# Patient Record
Sex: Female | Born: 1950 | Race: Asian | Hispanic: No | Marital: Married | Smoking: Never smoker
Health system: Western US, Academic
[De-identification: ages and names within clinical notes are randomized; demographics above are authoritative.]

---

## 2018-10-28 ENCOUNTER — Ambulatory Visit (INDEPENDENT_AMBULATORY_CARE_PROVIDER_SITE_OTHER)

## 2018-10-28 DIAGNOSIS — G5702 Lesion of sciatic nerve, left lower limb: Principal | ICD-10-CM

## 2018-10-31 ENCOUNTER — Ambulatory Visit (INDEPENDENT_AMBULATORY_CARE_PROVIDER_SITE_OTHER)

## 2018-11-04 ENCOUNTER — Ambulatory Visit (INDEPENDENT_AMBULATORY_CARE_PROVIDER_SITE_OTHER)

## 2018-11-04 DIAGNOSIS — G5702 Lesion of sciatic nerve, left lower limb: Principal | ICD-10-CM

## 2018-11-07 ENCOUNTER — Ambulatory Visit (INDEPENDENT_AMBULATORY_CARE_PROVIDER_SITE_OTHER)

## 2018-11-07 DIAGNOSIS — G5702 Lesion of sciatic nerve, left lower limb: Principal | ICD-10-CM

## 2018-11-11 ENCOUNTER — Ambulatory Visit (INDEPENDENT_AMBULATORY_CARE_PROVIDER_SITE_OTHER)

## 2018-11-14 ENCOUNTER — Encounter (INDEPENDENT_AMBULATORY_CARE_PROVIDER_SITE_OTHER): Payer: Self-pay | Admitting: Internal Medicine

## 2018-11-14 ENCOUNTER — Ambulatory Visit (INDEPENDENT_AMBULATORY_CARE_PROVIDER_SITE_OTHER): Admitting: Internal Medicine

## 2018-11-14 ENCOUNTER — Ambulatory Visit (INDEPENDENT_AMBULATORY_CARE_PROVIDER_SITE_OTHER)

## 2018-11-14 VITALS — BP 171/79 | HR 66 | Temp 98.4°F | Ht 60.0 in | Wt 114.0 lb

## 2018-11-14 DIAGNOSIS — I639 Cerebral infarction, unspecified: Secondary | ICD-10-CM

## 2018-11-14 DIAGNOSIS — M5030 Other cervical disc degeneration, unspecified cervical region: Principal | ICD-10-CM

## 2018-11-14 DIAGNOSIS — G5702 Lesion of sciatic nerve, left lower limb: Principal | ICD-10-CM

## 2018-11-14 DIAGNOSIS — I10 Essential (primary) hypertension: Secondary | ICD-10-CM

## 2018-11-14 MED ORDER — TRIAMCINOLONE ACETONIDE 0.5 % EX CREA
TOPICAL_CREAM | CUTANEOUS | Status: AC
Start: 2018-10-09 — End: ?

## 2018-11-14 MED ORDER — DICLOFENAC SODIUM 1 % EX GEL
TRANSDERMAL | Status: AC
Start: 2018-10-11 — End: ?

## 2018-11-14 MED ORDER — FLUTICASONE PROPIONATE 50 MCG/ACT NA SUSP
NASAL | Status: AC
Start: 2018-10-31 — End: ?

## 2018-11-14 MED ORDER — ROSUVASTATIN CALCIUM 5 MG OR TABS
ORAL_TABLET | ORAL | Status: AC
Start: 2018-10-09 — End: ?

## 2018-11-14 MED ORDER — ALENDRONATE SODIUM 70 MG OR TABS
ORAL_TABLET | ORAL | Status: AC
Start: 2018-10-09 — End: ?

## 2018-11-14 MED ORDER — CARVEDILOL 6.25 MG OR TABS
ORAL_TABLET | ORAL | Status: AC
Start: 2018-08-22 — End: ?

## 2018-11-14 MED ORDER — TRIAMCINOLONE ACETONIDE 0.1 % MOUTH/THROAT PASTE
PASTE | OROMUCOSAL | Status: AC
Start: 2018-10-09 — End: ?

## 2018-11-14 MED ORDER — CARVEDILOL 3.125 MG OR TABS
ORAL_TABLET | ORAL | Status: AC
Start: 2018-10-09 — End: ?

## 2018-11-15 ENCOUNTER — Telehealth: Payer: Self-pay | Admitting: Hospital

## 2018-11-15 NOTE — Telephone Encounter (Signed)
Verified: Mobile Phone   MyChart Activation code sent to patient's: Mobile Phone

## 2018-11-18 ENCOUNTER — Ambulatory Visit (INDEPENDENT_AMBULATORY_CARE_PROVIDER_SITE_OTHER)

## 2018-11-21 ENCOUNTER — Ambulatory Visit (INDEPENDENT_AMBULATORY_CARE_PROVIDER_SITE_OTHER)

## 2018-11-25 ENCOUNTER — Ambulatory Visit (INDEPENDENT_AMBULATORY_CARE_PROVIDER_SITE_OTHER)

## 2018-11-25 DIAGNOSIS — G5702 Lesion of sciatic nerve, left lower limb: Principal | ICD-10-CM

## 2018-11-27 LAB — HEMOGLOBIN A1C - EXTERNAL
Estimated Mean Glucose: 120 mg/dL
Hemoglobin A1C: 5.8 % — ABNORMAL HIGH (ref ?–5.7)

## 2018-11-28 ENCOUNTER — Ambulatory Visit (INDEPENDENT_AMBULATORY_CARE_PROVIDER_SITE_OTHER)

## 2018-11-28 DIAGNOSIS — G5702 Lesion of sciatic nerve, left lower limb: Principal | ICD-10-CM

## 2018-12-02 ENCOUNTER — Ambulatory Visit (INDEPENDENT_AMBULATORY_CARE_PROVIDER_SITE_OTHER)

## 2018-12-05 ENCOUNTER — Ambulatory Visit (INDEPENDENT_AMBULATORY_CARE_PROVIDER_SITE_OTHER)

## 2018-12-09 ENCOUNTER — Ambulatory Visit (INDEPENDENT_AMBULATORY_CARE_PROVIDER_SITE_OTHER)

## 2018-12-09 DIAGNOSIS — M5030 Other cervical disc degeneration, unspecified cervical region: Principal | ICD-10-CM

## 2018-12-12 ENCOUNTER — Ambulatory Visit (INDEPENDENT_AMBULATORY_CARE_PROVIDER_SITE_OTHER)

## 2018-12-12 DIAGNOSIS — M5030 Other cervical disc degeneration, unspecified cervical region: Principal | ICD-10-CM

## 2018-12-16 ENCOUNTER — Ambulatory Visit (INDEPENDENT_AMBULATORY_CARE_PROVIDER_SITE_OTHER)

## 2018-12-19 ENCOUNTER — Ambulatory Visit (INDEPENDENT_AMBULATORY_CARE_PROVIDER_SITE_OTHER)

## 2019-01-14 ENCOUNTER — Encounter (INDEPENDENT_AMBULATORY_CARE_PROVIDER_SITE_OTHER): Payer: Self-pay

## 2019-04-01 LAB — HEMOGLOBIN A1C - EXTERNAL
Estimated Mean Glucose: 126 mg/dL
Hemoglobin A1C: 6 % — ABNORMAL HIGH (ref ?–5.7)

## 2019-07-07 LAB — HEMOGLOBIN A1C - EXTERNAL
Estimated Mean Glucose: 120 mg/dL
Hemoglobin A1C: 5.8 % — ABNORMAL HIGH (ref ?–5.7)

## 2019-10-03 LAB — HEMOGLOBIN A1C - EXTERNAL
Estimated Mean Glucose: 108 mg/dL
Hemoglobin A1C: 5.4 % (ref ?–5.7)

## 2020-01-15 LAB — HEMOGLOBIN A1C - EXTERNAL
Estimated Mean Glucose: 126 mg/dL
Hemoglobin A1C: 6 % — ABNORMAL HIGH (ref ?–5.7)

## 2020-05-18 LAB — HEMOGLOBIN A1C - EXTERNAL
Estimated Mean Glucose: 137 mg/dL
Hemoglobin A1C: 6.4 % — ABNORMAL HIGH (ref ?–5.7)

## 2021-09-02 ENCOUNTER — Telehealth (HOSPITAL_COMMUNITY): Payer: Self-pay

## 2021-09-02 ENCOUNTER — Encounter (HOSPITAL_COMMUNITY): Payer: Self-pay

## 2021-09-02 NOTE — Telephone Encounter (Signed)
External Fax Referral    Left  voicemail for patient to schedule from referral     If patient calls back, schedule from Referral    Referral created and uploaded to Media

## 2021-12-07 ENCOUNTER — Other Ambulatory Visit: Payer: Self-pay

## 2021-12-14 ENCOUNTER — Ambulatory Visit (HOSPITAL_COMMUNITY): Payer: Medicare Other | Admitting: Vascular Neurology

## 2021-12-14 ENCOUNTER — Encounter (HOSPITAL_COMMUNITY): Payer: Self-pay

## 2021-12-16 ENCOUNTER — Other Ambulatory Visit: Payer: Self-pay

## 2021-12-20 ENCOUNTER — Other Ambulatory Visit: Payer: Self-pay

## 2021-12-20 ENCOUNTER — Ambulatory Visit: Payer: Medicare Other | Attending: Vascular Neurology | Admitting: Vascular Neurology

## 2021-12-20 DIAGNOSIS — I69398 Other sequelae of cerebral infarction: Secondary | ICD-10-CM | POA: Insufficient documentation

## 2021-12-20 DIAGNOSIS — I639 Cerebral infarction, unspecified: Secondary | ICD-10-CM | POA: Insufficient documentation

## 2021-12-20 DIAGNOSIS — F070 Personality change due to known physiological condition: Secondary | ICD-10-CM | POA: Insufficient documentation

## 2021-12-20 NOTE — Progress Notes (Signed)
---------------------(data below generated by Fernande Boyden, MD)------------------------    NEUROLOGY NOTE  Demographics:  Date: December 20, 2021  Patient Name: Ashley Richards  Medical Record #: [id number]  DOB: 1951/05/01  Age: 71 year old  Sex: female    Consult Requested by:  No att. providers found    Evaluator(s):   Patient was evaluated by: Neurology Attending     No chief complaint on file.      People present:  Patient    History of Present Illness:     Ashley Richards is an 71 year old female with HTN/HLD/multiple strokes.  Brother (stroke in 54s) and father with strokes (70s).  No smoking.       12/2016: first stroke ischemic. Ashley Richards started-- 65 years ago.   01/2017: expansion of first stroke (clot. Changed to plavix  04/2017: ICH in new area (right brain)  09/2017: ____. Was on plavix  10/2017:  2021: right frontal ischemic stroke  BRVO:  May 2022    On plavix, statin.   2018/2019 [name] [facility] [place]. All imaging/records at [facility]      Event monitor for >1 day.     Residual symptom of R sided sided heaviness since 2022.     Only has mild left sided headaches, rare.         REVIEW OF SYSTEMS  The patient was questioned about the following medical issues. All symptoms were denied, except those in HPI and in bold, which the patient does endorse.    Constitutional: weight loss, weight gain, fever, fatigue.  HENT: voice changes, dysphagia, hematemesis.  Eyes: visual disturbance, blurred vision, double vision, glaucoma, eye injury.  Respiratory: shortness of breath, wheezing, chronic cough.  Cardiovascular: chest pain, leg swelling, palpitations.  Gastrointestinal: nausea, vomiting, constipation, diarrhea, incontinence.  Genitourinary: dysuria, incontinence, frequency, kidney stones, sexual dysfunction.  Musculoskeletal: joint pain, joint swelling, gait problem.  Skin: color change, pallor, rash, itching.  Neurological: seizures, numbness, lightheadedness, tremors, muscle weakness/cramping, stroke,  headaches, head injury, LOC, falling, memory problems, anosmia, insomnia, sleep fragmentation.  Hematological: bruise/bleed easily, anemia.  Psychiatric/Behavioral: hallucinations, anxiety, depression, behavioral problems, abuse hx.  Endocrine: thyroid disease, excess thirst, excess urination.      Allergies:   No Known Allergies    Medications: None unless otherwise stated in HPI or below.  Current Outpatient Medications   Medication Sig   . alendronate (FOSAMAX) 70 MG tablet    . carvedilol (COREG) 3.125 MG tablet    . carvedilol (COREG) 6.25 MG tablet    . diclofenac (VOLTAREN) 1 % gel    . fluticasone propionate (FLONASE) 50 MCG/ACT nasal spray    . rosuvastatin (CRESTOR) 5 MG tablet    . triamcinolone (KENALOG) 0.5 % cream    . triamcinolone acetonide (KENALOG) 0.1 % paste      No current facility-administered medications for this visit.       PMH: No past medical history unless otherwise stated in HPI or below.   No past medical history on file.    PSH:  No past surgical history on file.    FH:  No family history of neurologic disease unless otherwise stated in HPI or below.     Social Hx: No active drug, alcohol, or tobacco use unless otherwise stated in HPI or below.     Exam:   On Exam today, I find:   Vital signs: Recorded in chart, and reviewed by me today.  There were no vitals taken for  this visit.    General Exam  General: Patient is well developed, & in no acute distress.     Neurologic Exam  Neurologic Exam:  MS:  Alert, awake, oriented to person, place, and time.  Language intact to naming, repetition, fluency, and comprehension. No neglect.        Pertinent Diagnostics:       None         Assessment/Plan: 71 yo female with recurrent strokes. Need to review records to obtain full details.   Get records and imaging from prior strokes.   New MRI brain w/wo GAD, include black blood to r/o inflammation  CTA head/neck  Hypercoagulable w/u.  Autoimmune workup  30 day monitor.   ILR if above  negative  Continue plavix  F/u 6 weeks.         Note Author: Fernande Boyden, MD      This patient was counseled extensively on the disease process above in regards to etiology, pathology, and prevention.  I have also spent time counseling them about the signs of neurologic emergencies for which they should call 911. This includes, but is not limited to symptoms of stroke such as weakness, numbness, diplopia, vertigo, gait imbalance, dysarthria, aphasia, and headache.

## 2021-12-21 ENCOUNTER — Telehealth (HOSPITAL_COMMUNITY): Payer: Self-pay

## 2021-12-21 ENCOUNTER — Other Ambulatory Visit: Payer: Self-pay

## 2021-12-21 NOTE — Telephone Encounter (Signed)
Received medical record reports from [place] on 12/21/21. Reports will be sent to [facility] Records Department to be scanned into [employer].    I've requested a hard copy of CD imaging from [place]:  P; [phone]  F: [phone]    Requested medical records and a hard copy of CD imaging from [facility]:  P: [phone]  F: [phone]    Closing encounter.

## 2021-12-27 ENCOUNTER — Telehealth (HOSPITAL_COMMUNITY): Payer: Self-pay | Admitting: Vascular Neurology

## 2021-12-27 NOTE — Telephone Encounter (Signed)
Patient called and stated she wants to have labs done at [facility] I faxed over lab order to [phone]. Just FYI Thank you.

## 2022-01-02 NOTE — Telephone Encounter (Signed)
Noted, closing encounter.

## 2022-01-03 ENCOUNTER — Telehealth (HOSPITAL_COMMUNITY): Payer: Self-pay

## 2022-01-03 NOTE — Telephone Encounter (Signed)
Sent second request for a hard copy CD of Imaging for patient from [facility] Medical Records Department.   P:[phone]  F: [phone]    Closing encounter.

## 2022-01-17 ENCOUNTER — Other Ambulatory Visit: Payer: Medicare Other | Attending: Vascular Neurology

## 2022-01-17 DIAGNOSIS — F070 Personality change due to known physiological condition: Secondary | ICD-10-CM | POA: Insufficient documentation

## 2022-01-17 DIAGNOSIS — I639 Cerebral infarction, unspecified: Secondary | ICD-10-CM

## 2022-01-17 DIAGNOSIS — I69398 Other sequelae of cerebral infarction: Secondary | ICD-10-CM | POA: Insufficient documentation

## 2022-01-17 LAB — BASIC METABOLIC PANEL, BLOOD
Anion Gap: 11 mmol/L (ref 7–15)
BUN: 24 mg/dL — ABNORMAL HIGH (ref 8–23)
Bicarbonate: 25 mmol/L (ref 22–29)
Calcium: 9.3 mg/dL (ref 8.5–10.6)
Chloride: 101 mmol/L (ref 98–107)
Creatinine: 0.89 mg/dL (ref 0.51–0.95)
Glucose: 135 mg/dL — ABNORMAL HIGH (ref 70–99)
Potassium: 4.5 mmol/L (ref 3.5–5.1)
Sodium: 137 mmol/L (ref 136–145)
eGFR Based on CKD-EPI 2021 Equation: >60 mL/min/{1.73_m2}

## 2022-01-17 LAB — HOMOCYSTEINE, TOTAL, BLOOD: Homocysteine, Total-Blood: 8 umol/L (ref 0–14)

## 2022-01-17 LAB — C4, BLOOD: C4: 26 mg/dL (ref 10–40)

## 2022-01-17 LAB — SED RATE, BLOOD: Sed Rate: 22 mm/hr (ref 0–30)

## 2022-01-17 LAB — IMMUNOGLOBULIN PANEL (IGA,IGG,IGM), BLOOD
IGA: 159 mg/dL (ref 70–400)
IGG: 1067 mg/dL (ref 700–1600)
IGM: 54 mg/dL (ref 40–230)

## 2022-01-17 LAB — RF (RHEUMATOID FACTOR), BLOOD: RF: <10 [IU]/mL (ref 0–13)

## 2022-01-17 LAB — C3, BLOOD: C3: 141 mg/dL (ref 90–180)

## 2022-01-17 LAB — FIBRINOGEN, BLOOD: Fibrinogen: 288 mg/dL (ref 200–450)

## 2022-01-17 LAB — C-REACTIVE PROTEIN, BLOOD: CRP: <0.3 mg/dL (ref <0.5)

## 2022-01-18 LAB — MTHFR, 2 MUTATIONS, BLOOD
MTHFR C.1286A>C VARIANT: NEGATIVE
MTHFR C.665C>T VARIANT: NEGATIVE

## 2022-01-18 LAB — CARDIOLIPIN IGG: Cardiolipin IgG: <2 GPL U/mL (ref <10.0)

## 2022-01-18 LAB — SSB, BLOOD: SSB Ab: <0.6 U/mL (ref <7.0)

## 2022-01-18 LAB — ANTI-THROMBIN III, BLOOD: Antithrombin III: 115 % (ref 80–140)

## 2022-01-18 LAB — HIV 1/2 ANTIBODY & P24 ANTIGEN ASSAY, BLOOD: HIV 1/2 Antibody & P24 Antigen Assay: NONREACTIVE

## 2022-01-18 LAB — DIL RUSSELL'S VIPER VENOM, BLOOD
DRWT, Control: 33.8 s
DRWT, Patient: 31.9 s
DRWT, Ratio (Pt/Con): 0.94 (ref <1.20)

## 2022-01-18 LAB — PROTEIN S ANTIGEN - FREE, BLOOD: Protein S Antigen - Free: 81 % (ref 65–145)

## 2022-01-18 LAB — PROTHROMBIN G20210A MUTATION, BLOOD: F2 c.*97G>A: NEGATIVE

## 2022-01-18 LAB — PROTEIN C ACTIVITY ASSAY, BLOOD: Protein C Activity: 134 % (ref 70–160)

## 2022-01-18 LAB — BETA 2 GLYCOPROTEIN ANTIBODY
Beta 2 Glycoprotein IgA: 2 U/mL (ref <7.0)
Beta 2 Glycoprotein IgG: <1.1 U/mL (ref <7.0)
Beta 2 Glycoprotein IgM: <2.4 U/mL (ref <7.0)

## 2022-01-18 LAB — HEPATITIS C AB, BLOOD: Hepatitis C Ab: NONREACTIVE

## 2022-01-18 LAB — FACTOR VIII ACTIVITY ASSAY, BLOOD: Factor VIII Activity Assay: 139 % (ref 55–140)

## 2022-01-18 LAB — HEPATITIS B CORE IGG/IGM PANEL, BLOOD
HBcAb IgM: NONREACTIVE
HBcAb Total: REACTIVE — AB

## 2022-01-18 LAB — SYPHILIS EIA SCREEN, BLOOD: Syphilis Screen: NEGATIVE

## 2022-01-18 LAB — SSA, BLOOD: SSA Ab: <0.5 U/mL (ref <7.0)

## 2022-01-18 LAB — ANA (ANTI-NUCLEAR AB), BLOOD: ANA (Anti-Nuclear Ab): NEGATIVE

## 2022-01-18 LAB — RNP (RIBONUCLEICPROTEIN AB), BLOOD: RNP (Ribonucloprotein Ab): 1.5 U/mL (ref <5.0)

## 2022-01-19 ENCOUNTER — Telehealth (HOSPITAL_COMMUNITY): Payer: Self-pay | Admitting: Vascular Neurology

## 2022-01-19 ENCOUNTER — Other Ambulatory Visit: Payer: Self-pay

## 2022-01-19 ENCOUNTER — Other Ambulatory Visit: Payer: Medicare Other

## 2022-01-19 DIAGNOSIS — I639 Cerebral infarction, unspecified: Secondary | ICD-10-CM

## 2022-01-19 LAB — EPG, SERUM
A/G Ratio, EPG: 1.08
Albumin, EPG: 3.59 gm/dL (ref 3.20–5.00)
Alpha 1, EPG: 0.25 gm/dL (ref 0.10–0.40)
Alpha 2, EPG: 0.86 gm/dL (ref 0.60–1.10)
Beta, EPG: 1.08 gm/dL (ref 0.60–1.30)
Gamma, EPG: 1.12 gm/dL (ref 0.70–1.50)
Total Protein, EPG: 6.9 gm/dL (ref 6.00–8.00)

## 2022-01-19 LAB — EPG, INTERPRETATION, SERUM

## 2022-01-19 NOTE — Telephone Encounter (Signed)
Discussed on telephone 01/18/22 with patient and husband Heb B ab REACTIVE results with patient. I explained to her that she should discuss these results with her PCP to see what needs to be done next. This can be that she has active or chronic Hepatitis B.

## 2022-01-20 ENCOUNTER — Telehealth (INDEPENDENT_AMBULATORY_CARE_PROVIDER_SITE_OTHER): Payer: Medicare Other

## 2022-01-20 ENCOUNTER — Encounter (HOSPITAL_COMMUNITY): Payer: Self-pay | Admitting: Vascular Neurology

## 2022-01-20 LAB — ANTI-NEUTRO CYTOPLASMA AB, BLOOD: Anti-Neutro Cytoplasm Ab: NEGATIVE

## 2022-01-20 LAB — 24 HOUR URINE PROTEIN ELECTROPHORESIS
Albumin, Urine: INVALID mg/dL — IN
Alpha 1, Urine: INVALID mg/dL — IN
Alpha 2, Urine: INVALID mg/dL — IN
Beta, Urine: INVALID mg/dL — IN
Duration: 24 hours (ref 0–48)
Gamma, Urine: INVALID mg/dL — IN
Total Protein, UPEP: INVALID mg/dL — IN
Volume: 2000 mL (ref >=600)

## 2022-01-20 LAB — APC RESISTANT FV(LEIDEN): Active Prot C Resist Fva: NORMAL

## 2022-01-23 LAB — CRYOGLOBULIN QUAL/QUANT 72 HR, REGULAR

## 2022-01-24 ENCOUNTER — Ambulatory Visit
Admission: RE | Admit: 2022-01-24 | Discharge: 2022-01-24 | Disposition: A | Discharge status: Home / Self Care | Payer: Medicare Other | Attending: Vascular Neurology | Admitting: Vascular Neurology

## 2022-01-24 ENCOUNTER — Ambulatory Visit (HOSPITAL_BASED_OUTPATIENT_CLINIC_OR_DEPARTMENT_OTHER)
Admit: 2022-01-24 | Discharge: 2022-01-24 | Disposition: A | Discharge status: Home Health | Payer: Medicare Other | Attending: Vascular Neurology | Admitting: Vascular Neurology

## 2022-01-24 ENCOUNTER — Other Ambulatory Visit: Payer: Self-pay

## 2022-01-24 DIAGNOSIS — I6622 Occlusion and stenosis of left posterior cerebral artery: Secondary | ICD-10-CM

## 2022-01-24 DIAGNOSIS — G9389 Other specified disorders of brain: Secondary | ICD-10-CM

## 2022-01-24 DIAGNOSIS — R9082 White matter disease, unspecified: Secondary | ICD-10-CM

## 2022-01-24 DIAGNOSIS — I672 Cerebral atherosclerosis: Secondary | ICD-10-CM

## 2022-01-24 DIAGNOSIS — I639 Cerebral infarction, unspecified: Secondary | ICD-10-CM | POA: Insufficient documentation

## 2022-01-24 DIAGNOSIS — I6389 Other cerebral infarction: Secondary | ICD-10-CM

## 2022-01-24 DIAGNOSIS — I6602 Occlusion and stenosis of left middle cerebral artery: Secondary | ICD-10-CM

## 2022-01-24 DIAGNOSIS — R930 Abnormal findings on diagnostic imaging of skull and head, not elsewhere classified: Secondary | ICD-10-CM

## 2022-01-24 DIAGNOSIS — Z8673 Personal history of transient ischemic attack (TIA), and cerebral infarction without residual deficits: Secondary | ICD-10-CM

## 2022-01-24 MED ORDER — GADOBUTROL 1 MMOL/ML IV SOLN (WRAPPED RECORD)
7.5000 mL | Freq: Once | INTRAVENOUS | Status: AC
Start: 2022-01-25 — End: 2022-01-24
  Administered 2022-01-24: 5 mL via INTRAVENOUS

## 2022-01-24 MED ORDER — IOHEXOL 350 MG/ML IV SOLN
50.0000 mL | Freq: Once | INTRAVENOUS | Status: AC
Start: 2022-01-24 — End: 2022-01-24
  Administered 2022-01-24: 50 mL via INTRAVENOUS
  Filled 2022-01-24: qty 50

## 2022-01-31 ENCOUNTER — Ambulatory Visit: Payer: Medicare Other | Attending: Vascular Neurology | Admitting: Vascular Neurology

## 2022-01-31 ENCOUNTER — Telehealth (HOSPITAL_COMMUNITY): Payer: Self-pay

## 2022-01-31 DIAGNOSIS — R9082 White matter disease, unspecified: Secondary | ICD-10-CM

## 2022-01-31 DIAGNOSIS — I6381 Other cerebral infarction due to occlusion or stenosis of small artery: Secondary | ICD-10-CM | POA: Insufficient documentation

## 2022-01-31 DIAGNOSIS — I69351 Hemiplegia and hemiparesis following cerebral infarction affecting right dominant side: Secondary | ICD-10-CM | POA: Insufficient documentation

## 2022-01-31 NOTE — Progress Notes (Signed)
---------------------(data below generated by Royya Fatima Modir, MD)--------------------     Patient Verification & Telemedicine Consent & Financial Waiver:    1.   Identity: I have verified this patient's identity to be accurate.  2.   Consent: I verify consent has been secured in one of the following methods: (a) obtained written/ online attestation consent (via MyChartVideoVisit pathway), (b) the spoke-side provider has obtained verbal or written consent from patient/surrogate (if this is a "provider to provider" evaluation), or (c) in all other cases, I have personally obtained verbal consent from the patient/ surrogate (noting all elements below) to perform this voluntary telemedicine evaluation (including obtaining history, performing examination and reviewing data provided by the patient).   The patient/ surrogate has the right to refuse this evaluation.  I have explained risks (including potential loss of confidentiality), benefits, alternatives, and the potential need for subsequent face to face care. Patient/ surrogate understands that there is a risk of medical inaccuracies given that our recommendations will be made based on reported data (and we must therefore assume this information is accurate).  Knowing that there is a risk that this information is not reported accurately, and that the telemedicine video, audio, or data feed may be incomplete, the patient agrees to proceed with evaluation and holds us harmless knowing these risks.  3.   Healthcare Team: The patient/ surrogate has been notified that other healthcare professionals (including students, residents and technical personnel) may be involved in this audio-video evaluation.   All laws concerning confidentiality and patient access to medical records and copies of medical records apply to telemedicine.  4.   Privacy: If this is a MyChart Video Visit, the patient/ surrogate has received the [place] Notice of Privacy Practices via E-Checkin process.   For all other video visit techniques, I have verbally provided the patient/ surrogate with the [place] web link in English ([email or web address]://[email or web address]/hipaa.aspx) or Spanish (https://health..edu/hipaa/Pages/hipaa_sp.aspx).  The patient/ surrogate acknowledges both being provided the NPP link, and has been offered to have the NPP mailed to the patient/ surrogate by US mail.  The patient/ surrogate has voiced understanding an acknowledgement of receipt of this NPP web address.  If the patient/surrogate has elected to receive the NPP via US mail, I verify that the NPP will be sent promptly to the patient/surrogate via US mail.  5.   Capacity: I have reviewed this above verification and consent paragraph with the patient/ surrogate and the patient is capacitated or has a surrogate. If the patient is not capacitated to understand the above, and no surrogate is available, since this is not an emergency evaluation, the visit will be rescheduled until such time that the patient can consent, or the surrogate is available to consent. If this is an emergency evaluation and the patient is not capacitated to understand the above, and no surrogate is available, I am proceeding with this evaluation as this is felt to be an emergency setting and no appropriate specialist is available at the bedside to perform these evaluations.  6.   Financial Waiver: If this is a MyChart Video Visit, the patient has been made aware of the financial waiver via E-Checkin process.  For all other video visit techniques, an E-Checkin process is not performed.  As such, I have personally verbally informed the patient/ surrogate that this evaluation will be a billable encounter similar to an in-person clinic visit, and the patient/ surrogate has agreed to pay the fee for services rendered.  If we are billing insurance for the   patient's telehealth visit, her out-of-pocket cost will be determined based on her plan and will be billed to her.  The  patient/ surrogate has also been informed that if the patient does not have insurance or does not wish to use insurance, [place] [employer] price for a primary care telehealth visit is $59.00 and specialist telehealth visit is $88.00.  I have further informed the patient/ surrogate that in the event the patient has additional services provided in conjunction with the specialty visit (Ex. Psychotherapy services), those services will be billed at the current rate less a 45% discount.  7.   Intra-State Location: The patient/ surrogate attests to understanding that if the patient accesses these services from a location outside of [place], that the patient does so at the patient's own risk and initiative and that the patient is ultimately responsible for compliance with any laws or regulations associated with the patient's use.  8.   Specific Use:The patient/ surrogate understands that [place] [facility] makes no representation that materials or servicesdelivered via telecommunication services, or listed on telemedicine websites, are appropriate or available for use in any other location.           Demographics:  Medical Record #: [id number]  Date: February 07, 2022  Patient Name: Ashley Richards  DOB: [birth year]  Age: 71 year old  Sex: female  Location: Home address on file  Patient seen Status: Patient was evaluated via telemedicine (audio or audio/video)     Evaluator(s):  Jenean Scholes was evaluated by me today.    Clinic Location: [place]  [place] [id number]             ---------------------(data below generated by Elizabeth Sauer. Poynor, MD)------------------------    NEUROLOGY NOTE  Demographics:  Date: January 31, 2022  Patient Name: Ashley Richards  Medical Record #: [id number]  DOB: 02/03/1951  Age: 71 year old  Sex: female    Consult Requested by:  No att. providers found    Evaluator(s):   Patient was evaluated by: Neurology Attending, Neurology Fellow    Chief Complaint    Patient presents with   . Recheck       People present:  Patient    History of Present Illness:     Ashley Richards is an 71 year old female with HTN/HLD/multiple strokes.  Brother (stroke in 25s) and father with strokes (21s).  No smoking.     12/2016: first stroke ischemic. Diona Fanti started-- 65 years ago.   01/2017: expansion of first stroke (clot. Changed to plavix)  04/2017: ICH in new area (right brain)  09/2017: ____. Was on plavix  10/2017:  2021: right frontal ischemic stroke  BRVO:  May 2022    On plavix, statin.   2018/2019 [place] in [place] All imaging/records at [facility]    Event monitor for >1 day.   Residual symptom of R sided sided heaviness since 2022.   Only has mild left sided headaches, rare.    Interval history since last appointment on 12/20/21:  - She states that over the last 6 months her weakness and stiffness is worse, she takes methocarbamol which has not helped  - Last appointment plan was to obtain records and imaging from prior strokes.   - MRI brain 01/24/22: Multiple chronic lacunar infarcts and severe, extensive, white matter disease most likely related to chronic microangiopathy. Multifocal intracranial arterial contour irregularities favored to represent  longstanding atherosclerosis.  - CTA head/neck 01/24/22:  Diffuse intracranial atherosclerosis with vessel contour irregularities and severe stenosis at the left M2 inferior division origin and left P1-P2 segments. Mildly reduced caliber of the right cervical ICA. No significant stenosis or occlusion involving the major neck arteries.  - Hypercoagulable/autoimmune workup negative to date including: UPEP, Antihrombin III, EPG, SSB, SSA, Syphilis, RNP, RF, IgA, IgG, IgM, HIV, Hep C, ESR, C4, C3, Cryoglobulin, CRP, ANCA, ANA, prothrombin G20210A mutation, Protein S, Protein C, MTHFR mutations, fibrinogen, homocysteine, factor VIII activity assay, Dil Russell's Viper Venom, Cardiolipin IgG, Beta 2 glycopretein  antibody, factor V leidein  - 30 day monitor: not completed  - Of note, patient states that she is moving to [place], [place] in 1 month       REVIEW OF SYSTEMS  The patient was questioned about the following medical issues. All symptoms were denied, except those in HPI and in bold, which the patient does endorse.    Constitutional: weight loss, weight gain, fever, fatigue.  HENT: voice changes, dysphagia, hematemesis.  Eyes: visual disturbance, blurred vision, double vision, glaucoma, eye injury.  Respiratory: shortness of breath, wheezing, chronic cough.  Cardiovascular: chest pain, leg swelling, palpitations.  Gastrointestinal: nausea, vomiting, constipation, diarrhea, incontinence.  Genitourinary: dysuria, incontinence, frequency, kidney stones, sexual dysfunction.  Musculoskeletal: joint pain, joint swelling, gait problem.  Skin: color change, pallor, rash, itching.  Neurological: seizures, numbness, lightheadedness, tremors, muscle weakness/cramping, stroke, headaches, head injury, LOC, falling, memory problems, anosmia, insomnia, sleep fragmentation.  Hematological: bruise/bleed easily, anemia.  Psychiatric/Behavioral: hallucinations, anxiety, depression, behavioral problems, abuse hx.  Endocrine: thyroid disease, excess thirst, excess urination.      Allergies:   Allergies   Allergen Reactions   . Percocet [Apap-Fd&C Blue #1-Oxycodone] Unspecified       Medications: None unless otherwise stated in HPI or below.  Current Outpatient Medications   Medication Sig   . alendronate (FOSAMAX) 70 MG tablet    . carvedilol (COREG) 3.125 MG tablet    . carvedilol (COREG) 6.25 MG tablet    . diclofenac (VOLTAREN) 1 % gel    . fluticasone propionate (FLONASE) 50 MCG/ACT nasal spray    . rosuvastatin (CRESTOR) 5 MG tablet    . triamcinolone (KENALOG) 0.5 % cream    . triamcinolone acetonide (KENALOG) 0.1 % paste      No current facility-administered medications for this visit.       PMH: No past medical history  unless otherwise stated in HPI or below.   No past medical history on file.    PSH:  No past surgical history on file.    FH:  No family history of neurologic disease unless otherwise stated in HPI or below.     Social Hx: No active drug, alcohol, or tobacco use unless otherwise stated in HPI or below.     Exam:   On Exam today, I find:   Vital signs: Recorded in chart, and reviewed by me today.  Wt 52.2 kg (115 lb)   BMI 22.46 kg/m     General Exam  General: Patient is well developed, & in no acute distress.     Neurologic Exam  Neurologic Exam:  MS:  Alert, awake, oriented to person, place, and time.  Language intact to naming, repetition, fluency, and comprehension. No neglect.        Pertinent Diagnostics:       MRI Brain 01/24/22  1. No acute intracranial finding.  2. Multiple chronic lacunar infarcts and severe,  extensive, white matter disease most likely related to chronic microangiopathy. Generalized, although central predominant, cerebral volume loss.  3. Thin eccentric plaque with enhancement and mild intrinsic T1-hyperintensity that may reflect intraplaque blood products noted at the superior wall of the right supraclinoid ICA, not associated with significant stenosis. Minimal eccentric enhancement in the right MCA branches, such as at the anterior wall of a small M2 branch, suggests additional atherosclerosis.  4. Multifocal intracranial arterial contour irregularities favored to represent longstanding atherosclerosis. There is severe stenosis at the left M2 inferior division origin and left P1-P2 segments. No significant cervical arterial stenosis.    CTA head and neck 01/24/22  1. No acute intracranial finding.  2. Multiple chronic lacunar infarcts and severe white matter hypodensities most likely related to chronic microangiopathy. Generalized, although centrally predominant, cerebral volume loss.  3. Diffuse intracranial atherosclerosis with vessel contour irregularities and severe stenosis at the  left M2 inferior division origin and left P1-P2 segments.  4. Mildly reduced caliber of the right cervical ICA. No significant stenosis or occlusion involving the major neck arteries.  5. Please see the separately dictated brain MRI and vessel wall imaging study for any additional findings.      Assessment/Plan:   1. 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 HTN, HLD    - Given stroke risk factors recommend routine secondary stroke prevention:   Antithrombotic: continue Plavix 75 mg daily   Blood pressure: maintain blood pressure <130/80, continue antihypertensives and management per primary   Cholesterol: maintain LDL <70, continue Crestor 20 mg daily (per patient recent LDL 80)   Diabetes: maintain HbA1c <7%, continue management per primary   Education: recommend smoking cessation, healthy diet, exercise regimen counseling as indicated   Follow up: with Neurology at [facility] and Neurosurgery  - Recommend CADASIL testing, CARASIL testing, and DSA per Neurosurgery and Neurology at [facility] because of extensive white matter disease and amount of strokes and need to rule out other potential etiologies  - Would consider starting baclofen for spasticity or botox injections if needed         Note Author: Elizabeth Sauer. Poynor, MD      This patient was counseled extensively on the disease process above in regards to etiology, pathology, and prevention.  I have also spent time counseling them about the signs of neurologic emergencies for which they should call 911. This includes, but is not limited to symptoms of stroke such as weakness, numbness,  diplopia, vertigo, gait imbalance, dysarthria, aphasia, and headache.        VASCULAR NEUROLOGY ATTENDING NOTE     Likely ICAD, but will rule out other etiologies with DSA as well as CADASIl testing.     I performed a history and physical examination of the patient and discussed the management with the resident/fellow/NP.  I reviewed and/or edited their note and agree with the documented findings and plan of care or have otherwise stated my adjustments above.     This patient and/or family was counseled extensively on the disease process above in regards to etiology, pathology, and prevention.  I have also spent time counseling them about the signs of neurologic emergencies for which they should call  911. This includes, but is not limited to symptoms of stroke such as weakness, numbness, diplopia, vertigo, gait imbalance, dysarthria, aphasia, and headache.        Wilhemina Cash, M.D.   Associate Professor of [employer]

## 2022-01-31 NOTE — Patient Instructions (Addendum)
-   Recommend CADASIL testing, CARASIL testing, and diagnostic angiogram per Neurosurgery and Neurology at [facility] because of extensive white matter disease and amount of strokes and need to rule out other potential etiologies  - Continue Plavix and Crestor

## 2022-01-31 NOTE — Interdisciplinary (Signed)
Spoke with patient and her son regarding upcoming MCVV. COnfirmed medications, allergies,vitals. Pt comfortable with online check in process. has clinic number if needs assitance.

## 2022-02-03 ENCOUNTER — Encounter (HOSPITAL_COMMUNITY): Payer: Self-pay | Admitting: Vascular Neurology

## 2022-02-06 ENCOUNTER — Telehealth (HOSPITAL_COMMUNITY): Payer: Self-pay

## 2022-02-06 ENCOUNTER — Encounter (HOSPITAL_COMMUNITY): Payer: Self-pay

## 2022-02-06 NOTE — Telephone Encounter (Signed)
Received a faxed letter head from [facility] stating in Comments: "No related imaging in radiology or cardiology. Thank you"    Return fax information scanned into Epic media. Forwarding message to Dr. Rivka Barbara, [facility]!      Closing encounter.

## 2022-02-09 ENCOUNTER — Encounter (HOSPITAL_COMMUNITY): Payer: Self-pay | Admitting: Vascular Neurology

## 2022-08-02 IMAGING — MR MRI PELVIS WITHOUT CONTRAST
4 of 6 series · 10 of 48 positions shown · IV contrast (gadolinium)
Comparison: None

________________________________________________________________________________________________ 
MRI PELVIS WITHOUT CONTRAST, 08/02/2022 [DATE]: 
CLINICAL INDICATION: Sciatica, right side.
TECHNIQUE: Multiplanar, multiecho position MR images of the pelvis were 
performed without intravenous gadolinium enhancement. Patient was scanned on a 
1.5T magnet.

[Series 101: survey · axial · 10.0mm · 1.25mm/px · z∈[-34,+200]mm · 3 of 11 slices shown]
[im 1/11]
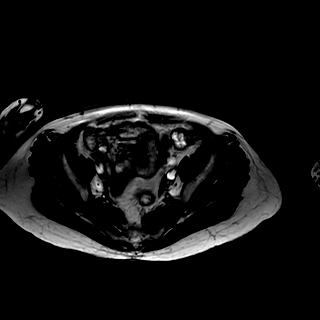
[im 6/11]
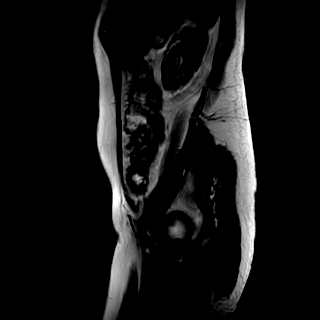
[im 11/11]
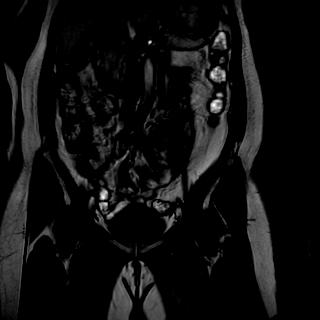

[Series 201: t1_(person_name) · axial · 6.0mm · 0.31mm/px · z∈[-161,+23]mm · 3 of 33 slices shown]
[im 5/33]
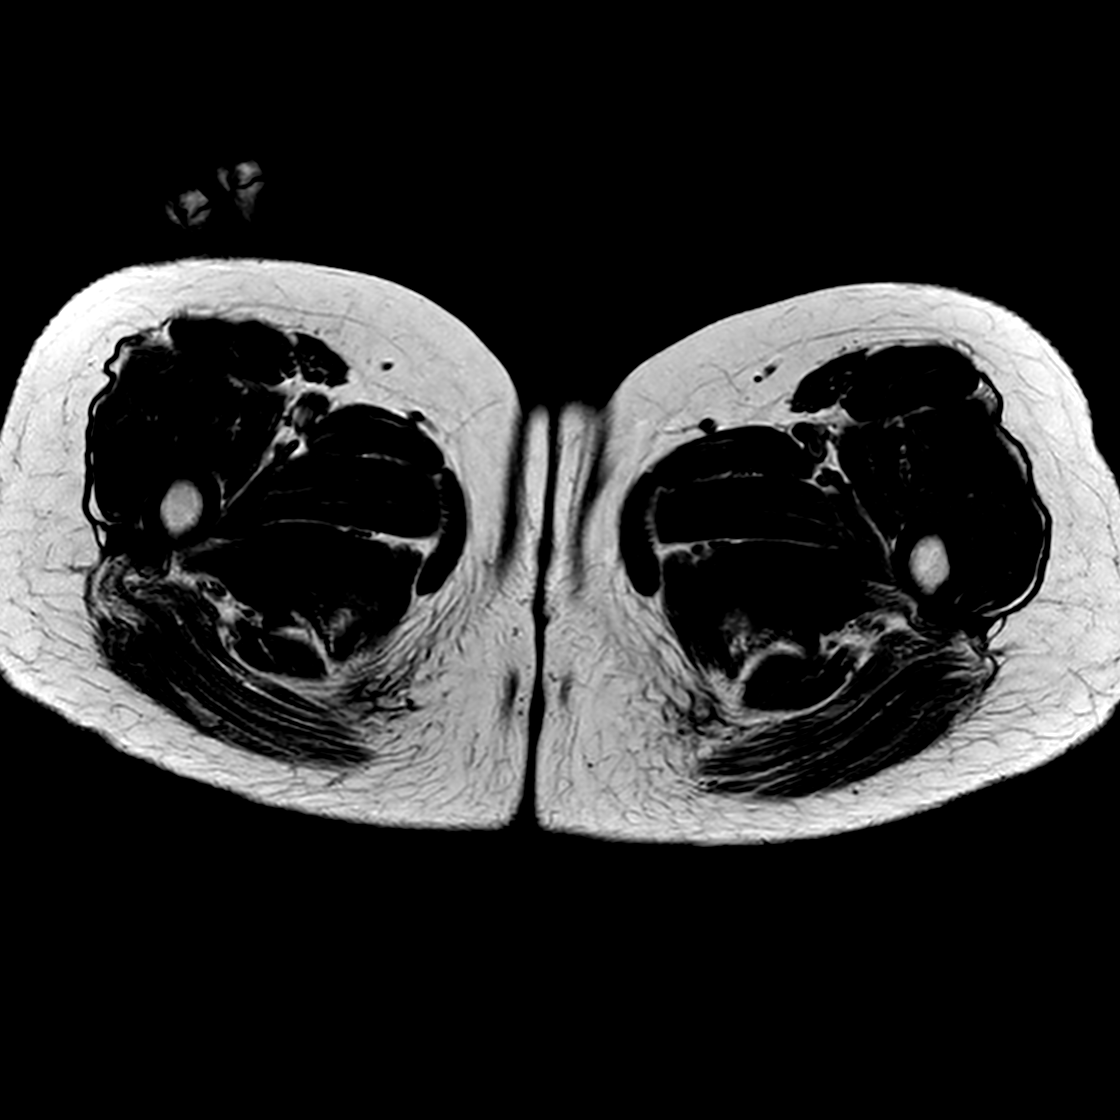
[im 19/33]
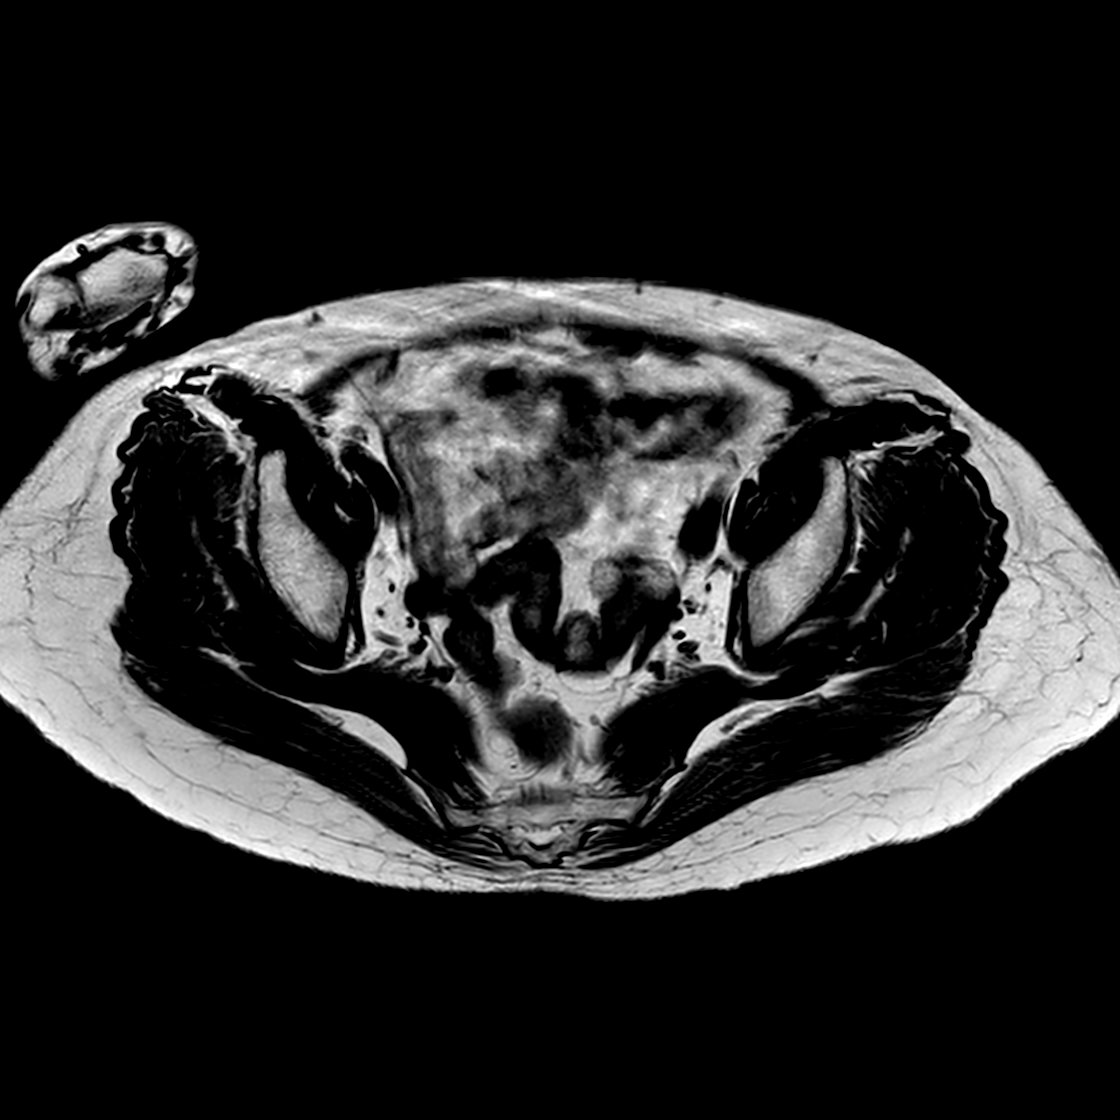
[im 28/33]
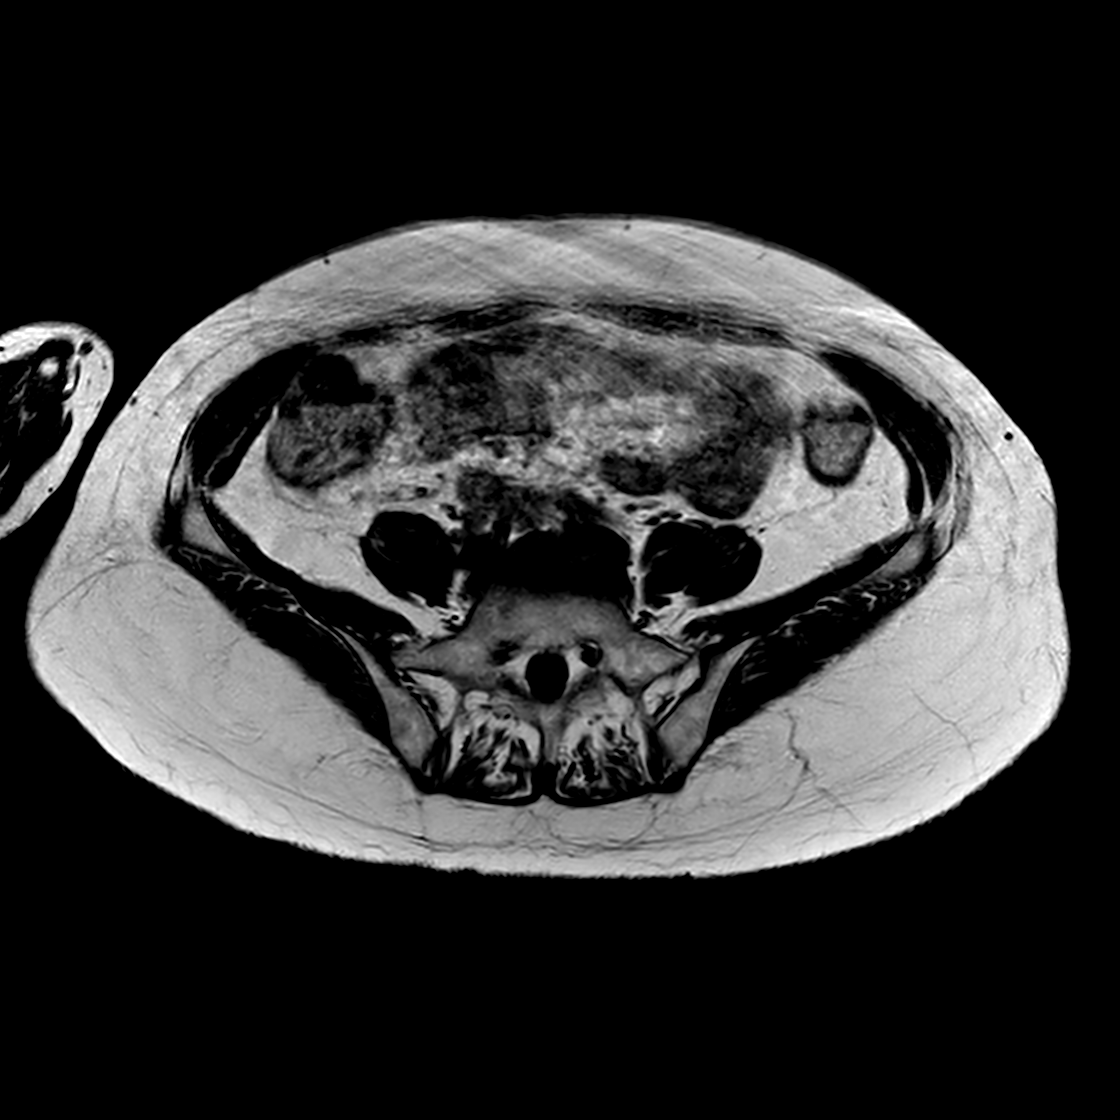

[Series 301: (person_name)_(person_name)_(person_name) · axial · 6.0mm · 0.66mm/px · z∈[-161,+23]mm · 3 of 33 slices shown]
[im 5/33]
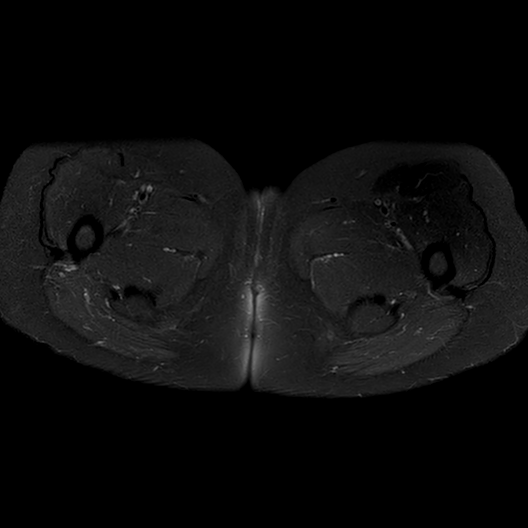
[im 19/33]
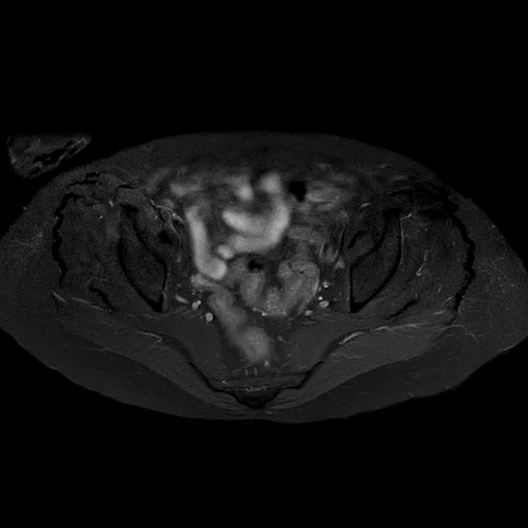
[im 28/33]
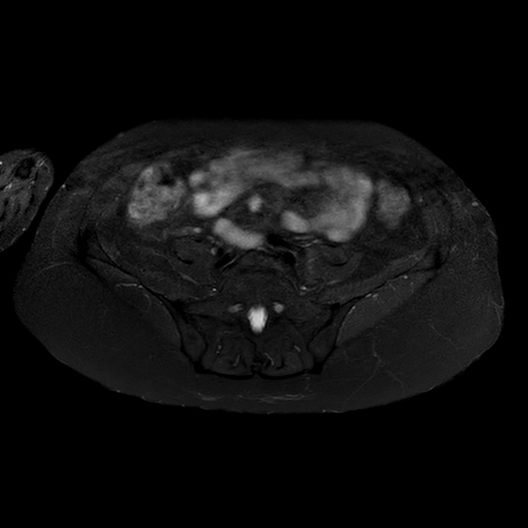

[Series 401: t1_cor · coronal · 5.0mm · 0.61mm/px · 1 of 33 slices shown]
[im 5/33]
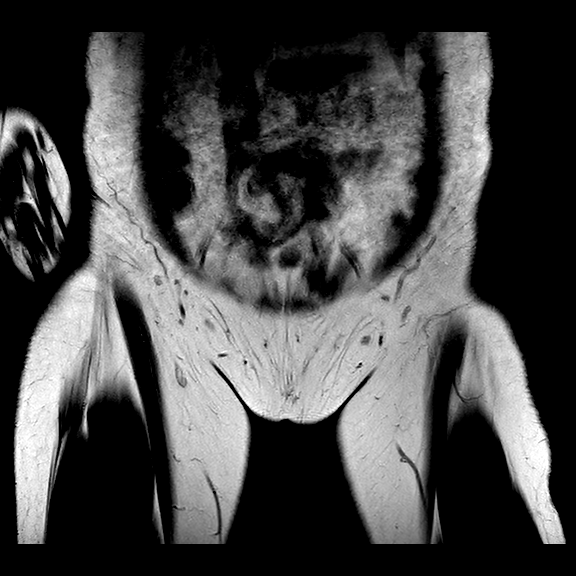

[10 of 48 positions shown; findings below may reference images not displayed]

FINDINGS: HIPS:  No discrete articular cartilaginous loss of either hip. No labral tear. 
No paralabral cyst. No hip joint effusion. Both femoral heads maintain a 
spherical configuration without evidence of avascular necrosis or subarticular 
collapse. No abnormal morphology of the proximal femurs or acetabulum to 
predispose to impingement  
BONES: Normal marrow signal intensity of the proximal hips, pelvis, sacrum and 
included lower lumbar spine. No fracture, contusion or marrow replacing lesion. 
SI joints show mild degenerative change. Please refer to 08/02/2022 lumbar spine 
MRI dictation for abnormal findings. 
SOFT TISSUES: The bilateral abductor cuffs are preserved. The insertions of the 
iliopsoas tendons are intact. The origins of the hamstrings are preserved. 
Rectus abdominis-adductor complex is preserved. The musculature is symmetric 
without strain, atrophy or mass. No focal fluid collection or distended bursa. 
Specifically, no iliopsoas or trochanteric bursitis. Included neurovascular 
bundles are negative.  Retroflexed uterus.
IMPRESSION: 1.  Pelvic joint spaces are preserved. 
2.  Please refer to 08/02/2022 lumbar spine MRI dictation for abnormal findings.

## 2022-08-02 IMAGING — MR MRI LUMBAR SPINE WITHOUT CONTRAST
6 of 8 series · 11 of 48 positions shown · IV contrast (gadolinium)
Comparison: None

________________________________________________________________________________________________ 
MRI LUMBAR SPINE WITHOUT CONTRAST, 08/02/2022 [DATE]: 
CLINICAL INDICATION: Right leg pain and numbness, low back pain
TECHNIQUE: Sagittal T1, Sagittal T2, Sagittal STIR, Axial T1 and Axial T2 MR 
images of the lumbar spine were performed without intravenous gadolinium 
enhancement.

[Series 101: survey · axial · 10.0mm · 1.25mm/px · z∈[-33,+201]mm · 2 of 10 slices shown]
[im 1/10]
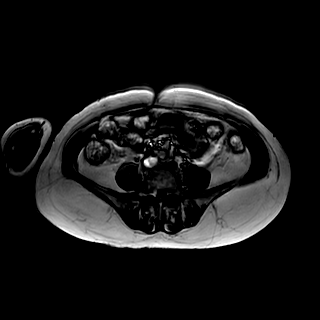
[im 10/10]
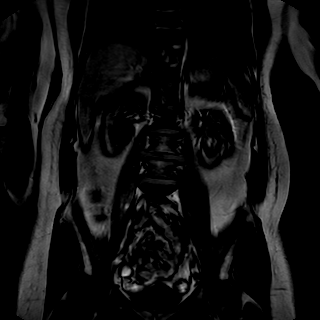

[Series 201: t2w_cor-surv · coronal · 6.0mm · 0.56mm/px · 1 of 10 slices shown]
[im 1/10]
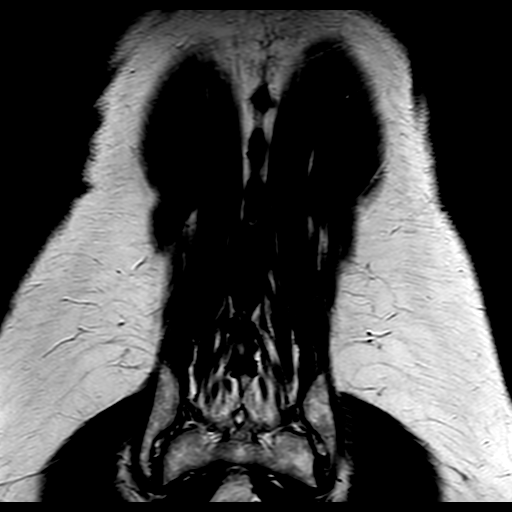

[Series 301: t1_tse_sag · sagittal · 4.0mm · 0.44mm/px · 2 of 17 slices shown]
[im 1/17]
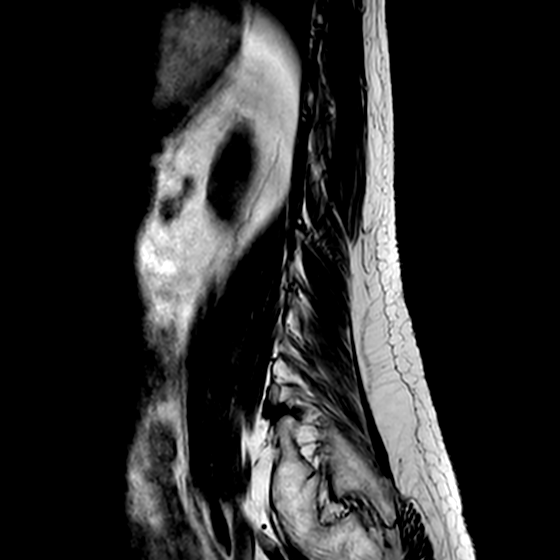
[im 17/17]
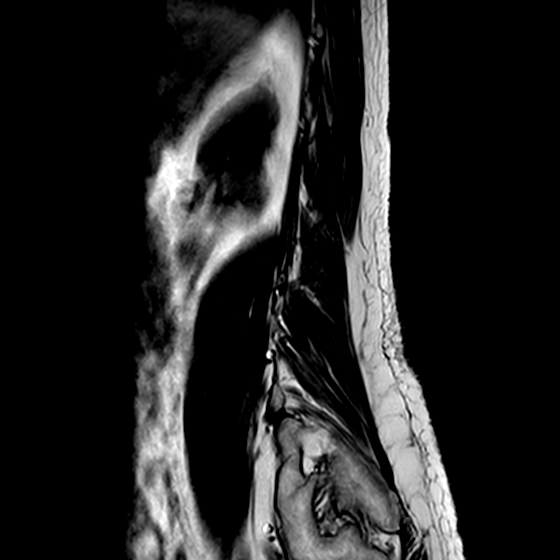

[Series 402: (id)_mdixon_tse · sagittal · 4.0mm · 0.38mm/px · 2 of 17 slices shown]
[im 1/17]
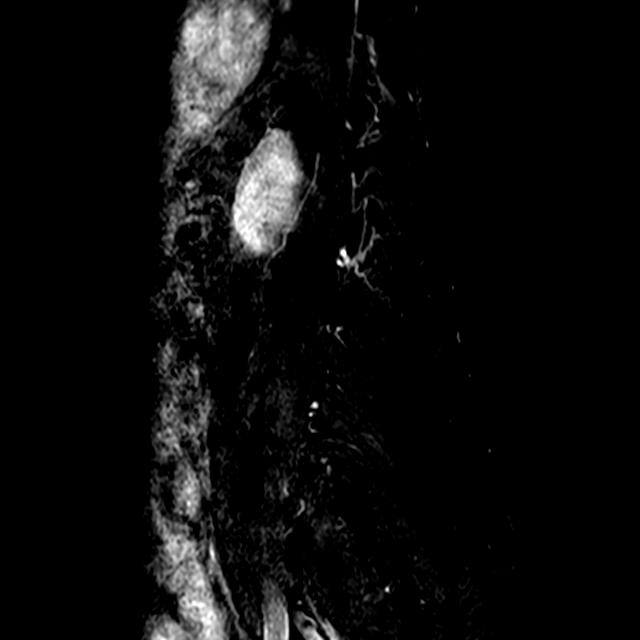
[im 17/17]
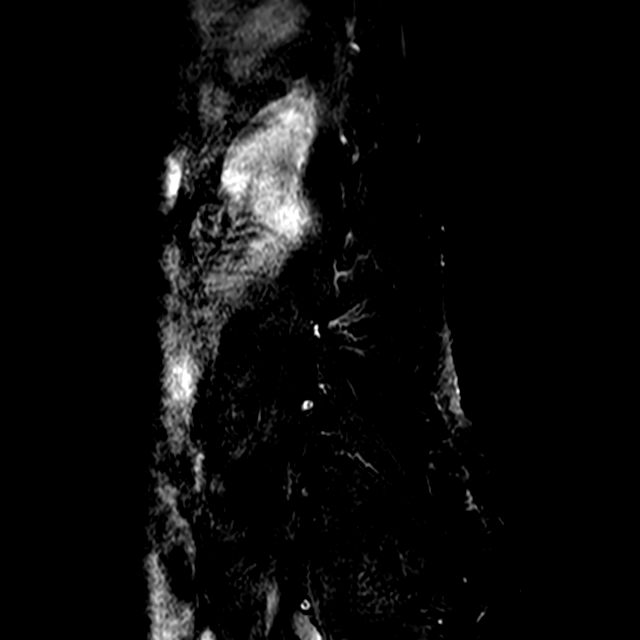

[Series 403: st2w_mdixon_tse · sagittal · 4.0mm · 0.38mm/px · 2 of 17 slices shown]
[im 1/17]
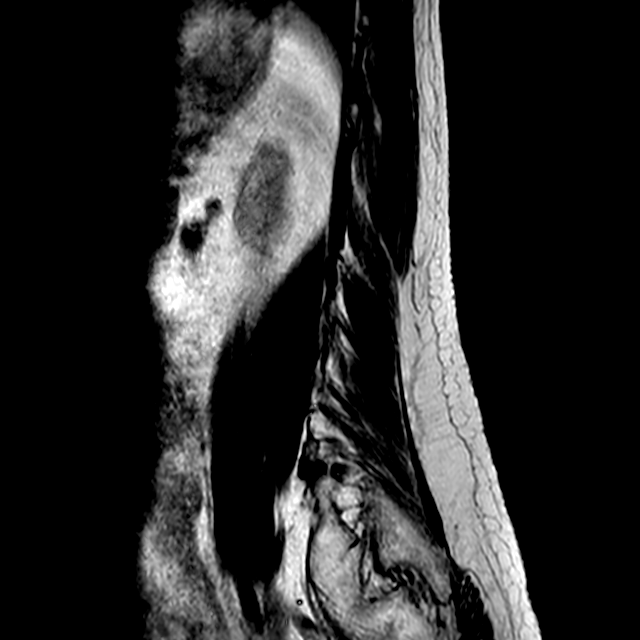
[im 17/17]
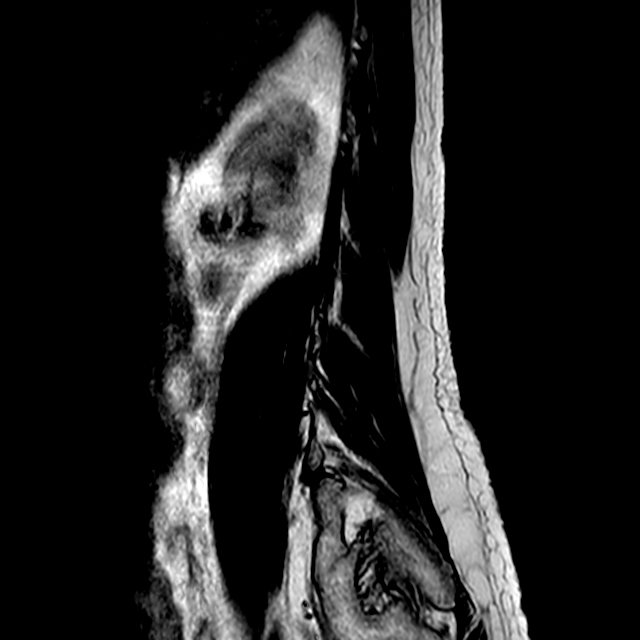

[Series 502: (id) view_ax mpr · axial · 1.0mm · 0.25mm/px · z∈[-57,-34]mm · 2 of 149 slices shown]
[im 8/149]
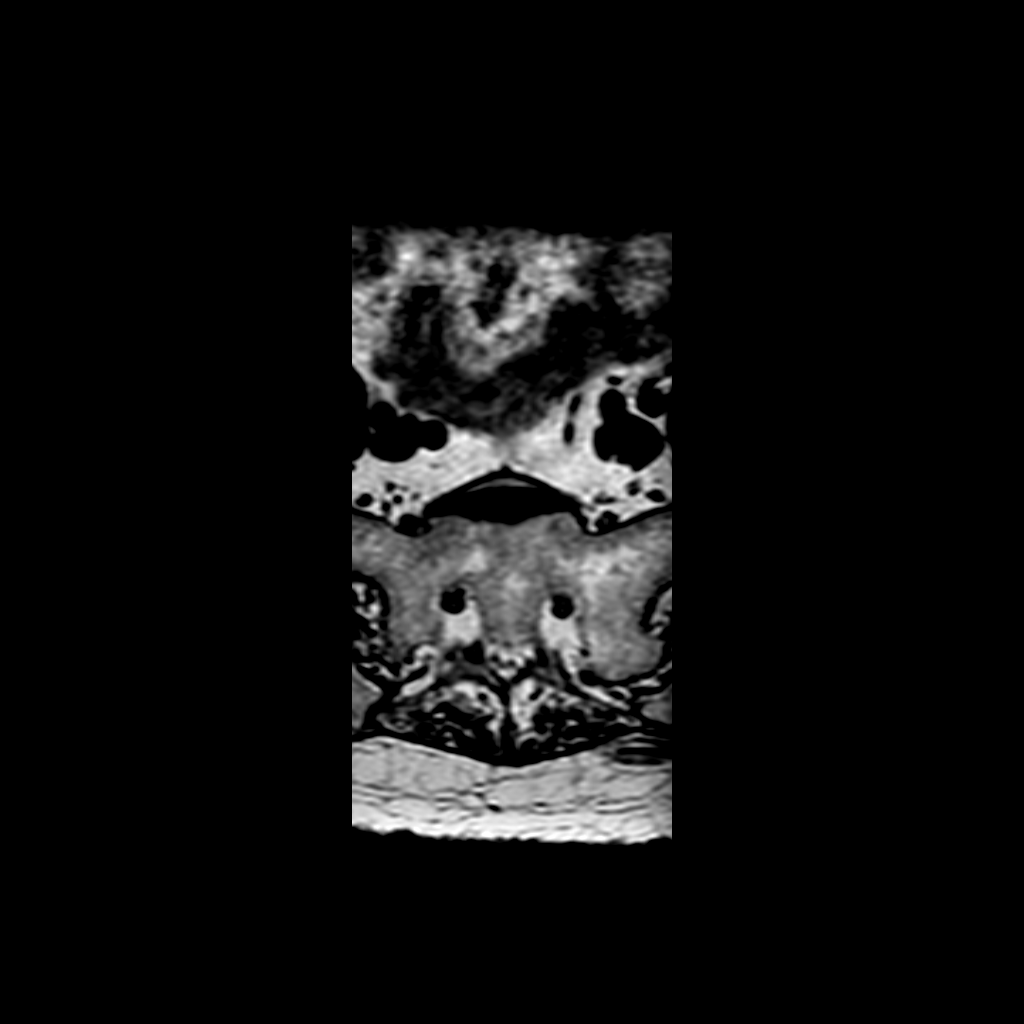
[im 24/149]
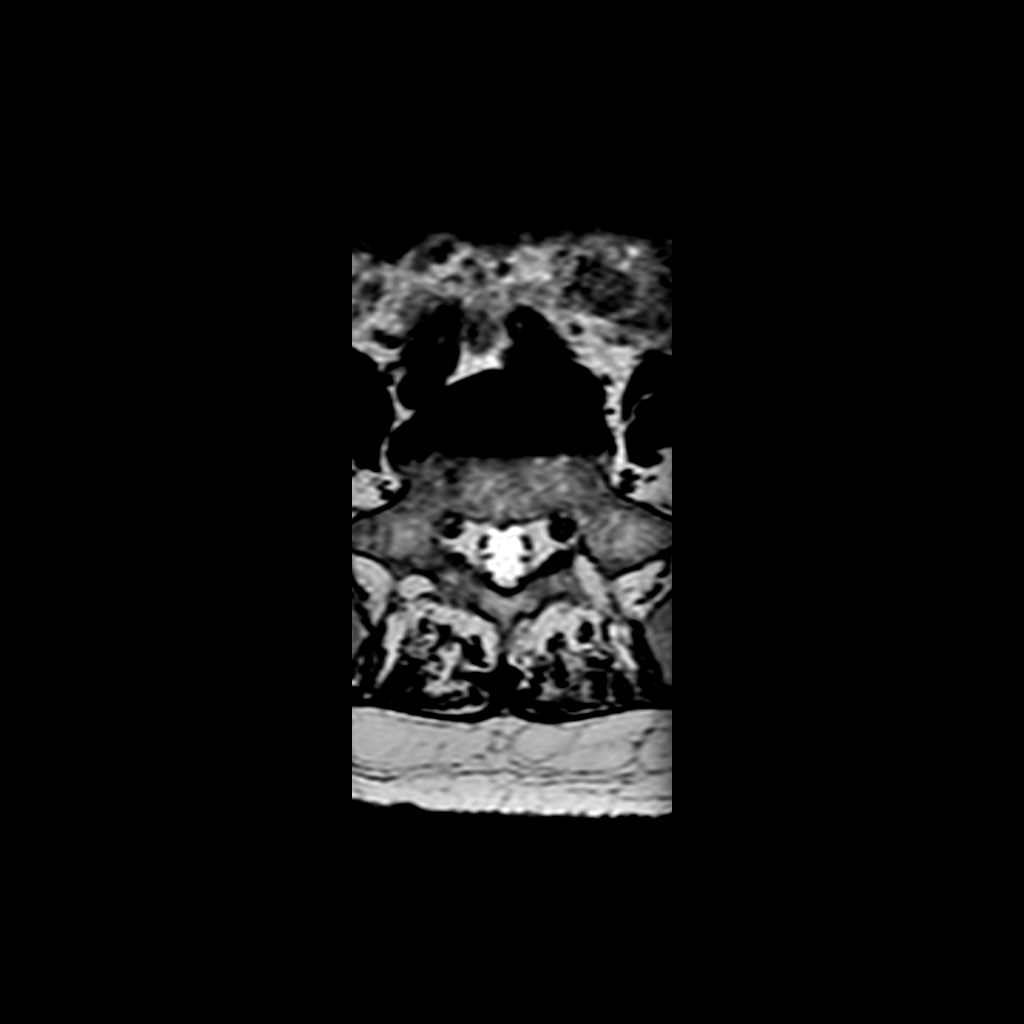

[11 of 48 positions shown; findings below may reference images not displayed]

FINDINGS: Lumbar vertebral heights are intact. Conus medullaris terminates 
opposite L1. There is no significant listhesis. No evidence for malignancy. 
There are mild Modic type I changes at L3-4. 
The L5-S1 disc space is rudimentary. L5 is transitional. 
At L4-5 there is a right paracentral disc extrusion deforming the right ventral 
thecal sac and impinging the right L5 nerve root. There are moderate facet 
degenerative changes. Foramina are open. 
At L3-4 broad-based disc bulge and mild to moderate facet change with 
ligamentous thickening contributes to borderline-mild canal stenosis. Foramina 
are open. 
At L2-3 the canal is open. Mild bilateral foraminal narrowing. 
At L1-2 there is broad-based disc bulge contributing to borderline canal 
stenosis. Foramina are open. 
T12-L1 unremarkable.
IMPRESSION: Caution as to labeling: The L5 segment is transitional. First fully formed 
cartilage which will disc spaces labeled L4-5. 
At L4-5 there is a right paracentral disc extrusion deforming the thecal sac and 
impinging the right L5 nerve root.

## 2022-11-12 IMAGING — MR MRI BRAIN WITHOUT CONTRAST
9 of 12 series · 34 of 48 positions shown · IV contrast (gadolinium)
Comparison: None.

________________________________________________________________________________________________ 
MRI BRAIN WITHOUT CONTRAST, 11/12/2022 [DATE]: 
CLINICAL INDICATION: TIA. Cerebral infarct.
TECHNIQUE: Multiplanar, multiecho position MR images of the brain were performed 
without intravenous gadolinium enhancement. Patient was scanned on a 3T magnet.

[Series 101: survey · axial · 10.0mm · 0.98mm/px · z∈[+0,+125]mm · 2 of 5 slices shown]
[im 1/5]
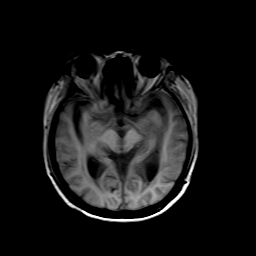
[im 5/5]
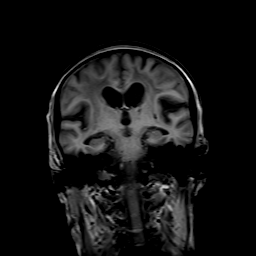

[Series 203: dadc map · axial · 4.0mm · 1.07mm/px · z∈[-69,+75]mm · 3 of 30 slices shown (1 of 2)]
[im 1/30]
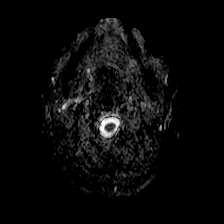
[im 15/30]
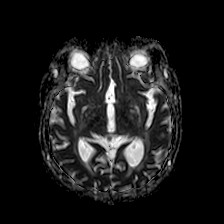
[im 30/30]
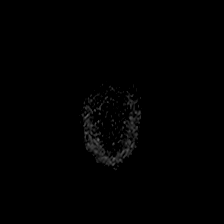

[Series 204: isob (id) · axial · 4.0mm · 1.07mm/px · z∈[-69,+75]mm · 3 of 30 slices shown (1 of 2)]
[im 1/30]
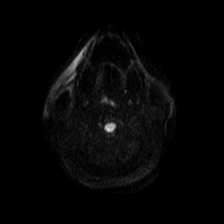
[im 15/30]
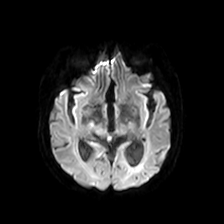
[im 30/30]
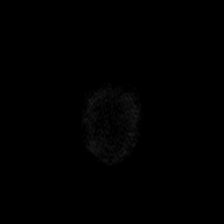

[Series 303: dadc map · coronal · 4.0mm · 0.90mm/px · 3 of 34 slices shown (2 of 2)]
[im 1/34]
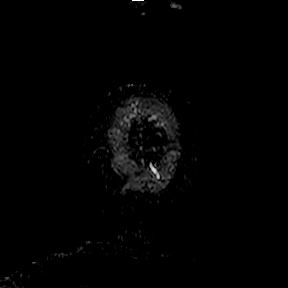
[im 17/34]
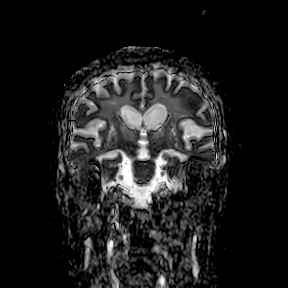
[im 34/34]
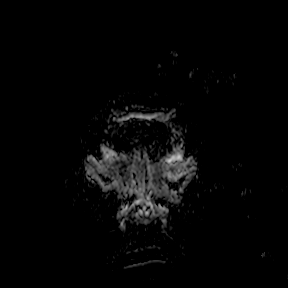

[Series 304: isob (id) · coronal · 4.0mm · 0.90mm/px · 3 of 33 slices shown (2 of 2)]
[im 1/33]
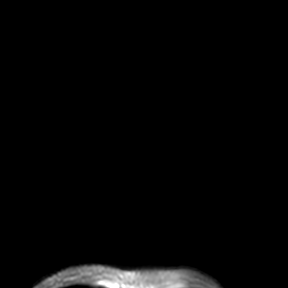
[im 17/33]
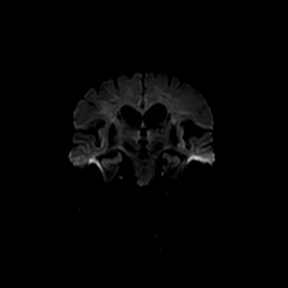
[im 33/33]
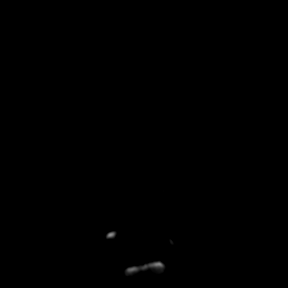

[Series 401: t1_se_sag · sagittal · 4.0mm · 0.45mm/px · 1 of 29 slices shown]
[im 1/29]
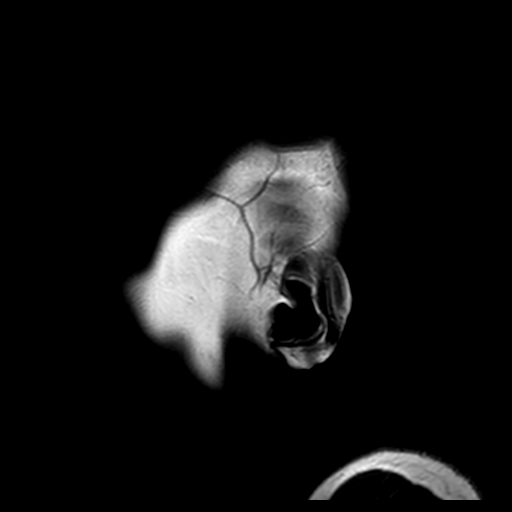

[Series 501: FLAIR fat-sat · axial · 5.0mm · 0.60mm/px · z∈[-78,+76]mm · 3 of 27 slices shown]
[im 1/27]
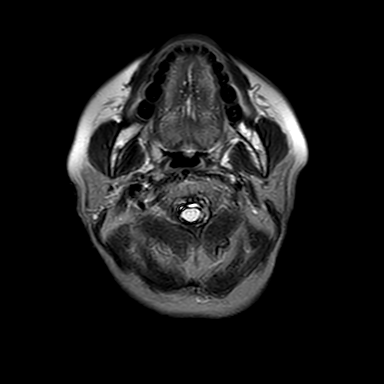
[im 14/27]
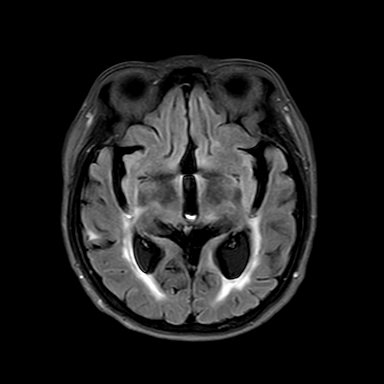
[im 27/27]
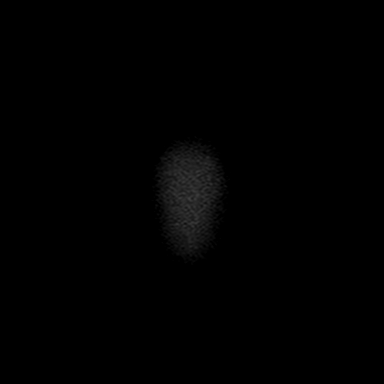

[Series 601: SWI · axial · 3.0mm · 0.53mm/px · z∈[-75,+73]mm · 8 of 100 slices shown (1 of 2)]
[im 1/100]
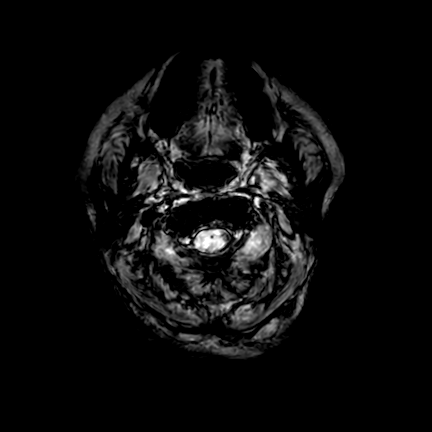
[im 12/100]
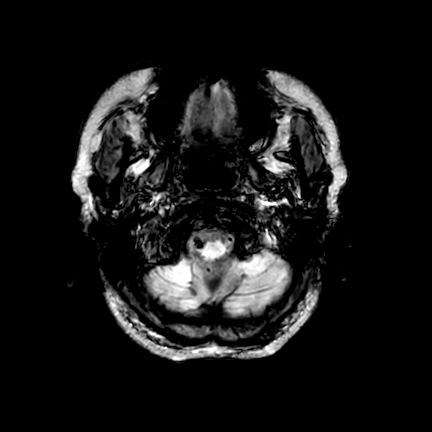
[im 34/100]
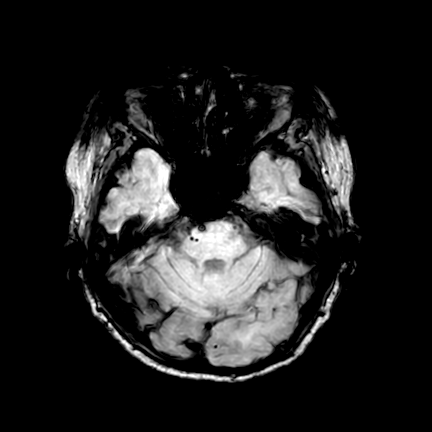
[im 45/100]
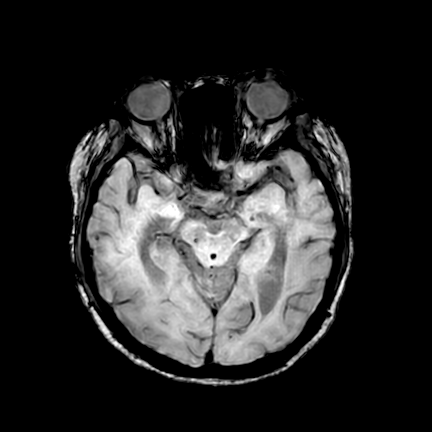
[im 56/100]
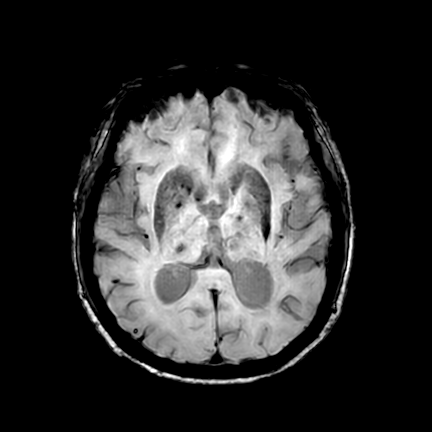
[im 67/100]
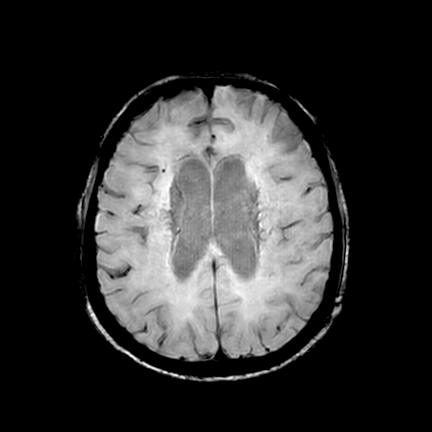
[im 89/100]
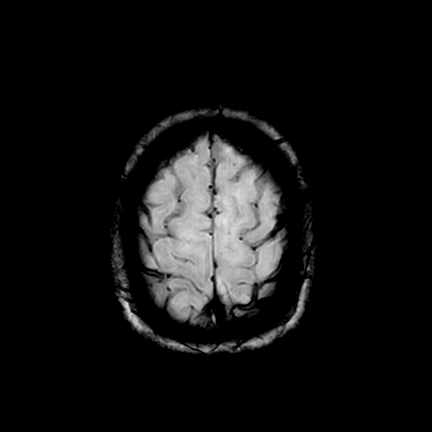
[im 100/100]
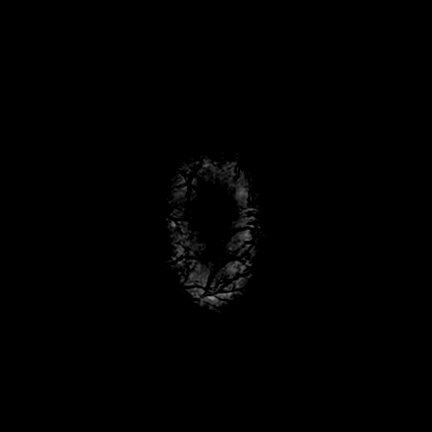

[Series 602: SWI · axial · 10.0mm · 0.53mm/px · z∈[-75,+77]mm · 8 of 78 slices shown (2 of 2)]
[im 1/78]
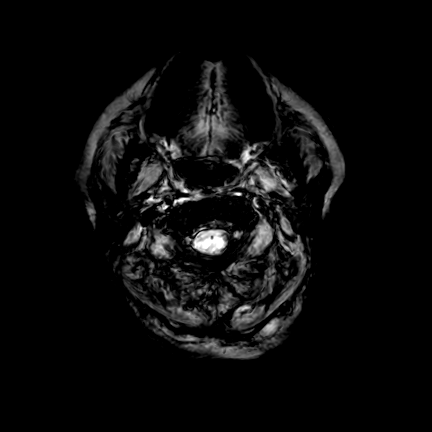
[im 12/78]
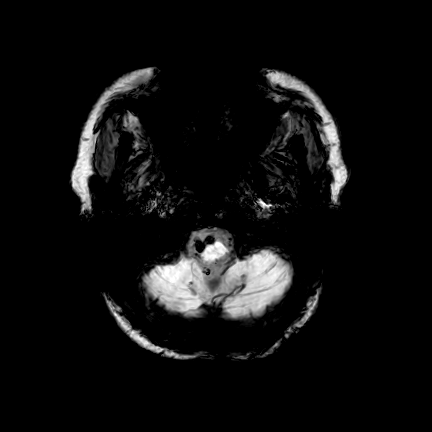
[im 23/78]
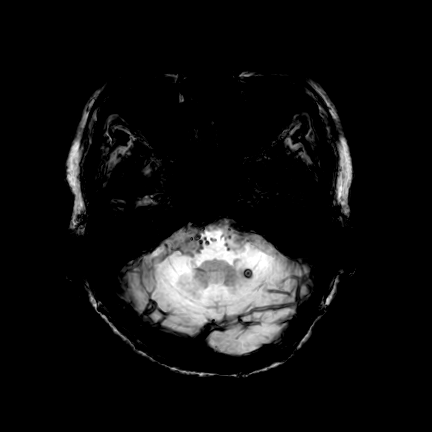
[im 34/78]
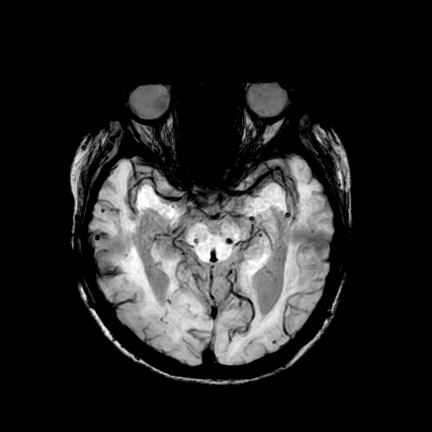
[im 45/78]
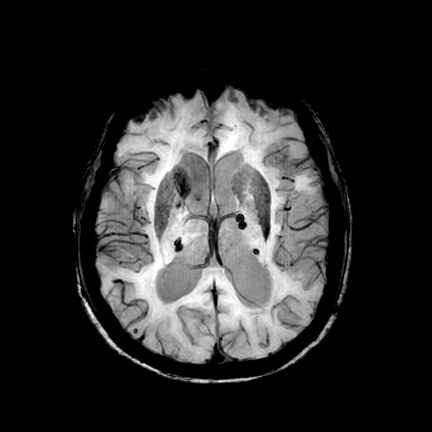
[im 56/78]
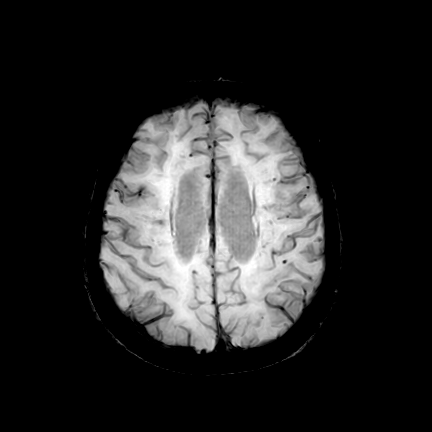
[im 67/78]
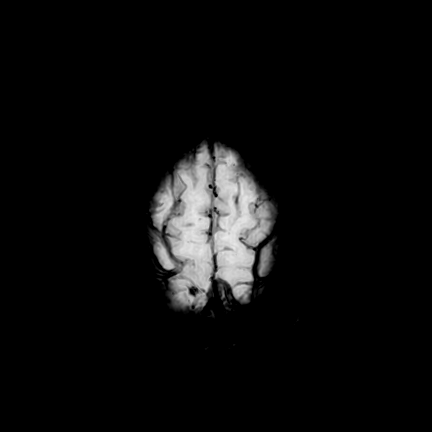
[im 78/78]
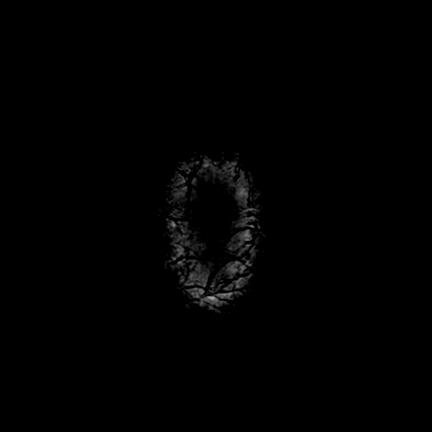

[34 of 48 positions shown; findings below may reference images not displayed]

FINDINGS: -------------------------------------------------------------------------------- 
------------------------- 
INTRACRANIAL: 
Moderate severe/severe increased signal intensity within the white matter tracts 
on the FLAIR images. Focal areas of decreased signal intensity on the 
susceptibility weighted images within the brainstem cerebellum, cerebrum and 
basal ganglia. No acute ischemia. No abnormal foci of susceptibility artifact in 
the brain. Patency of the intracranial vascular flow voids.  No acute 
intracranial hemorrhage, mass effect, midline shift. No large sellar mass. No 
hydrocephalus. Cerebral volume is age appropriate.  
-------------------------------------------------------------------------------- 
----------------------- 
OTHER: 
ORBITS/SINUSES/T-BONES:  Visualized orbits show no acute abnormality or mass.  
Mastoid air cells and middle ear cavities are grossly clear.  Mucosal thickening 
maxillary and ethmoid sinuses.. 
MARROW SIGNAL/SOFT TISSUES: No focal suspect signal abnormality.  
-------------------------------------------------------------------------------- 
-------------------
IMPRESSION: Moderate severe/severe small vessel disease. 
Hemosiderin deposits throughout the brainstem, cerebellum and cerebrum suggests 
previous microhemorrhage. Recommend clinical correlation for amyloid angiopathy.

## 2023-03-07 IMAGING — MG MAMMOGRAPHY SCREENING BILATERAL 3[PERSON_NAME]
8 series · 8 of 24 positions shown · non-contrast
Comparison: Comparison was made to prior examinations.

________________________________________________________________________________________________ 
******** ADDENDUM #1 ********/n 
The nodular density in the medial right breast is unchanged when compared to 
previous mammograms and scarring in the left breast is also unchanged. Patient 
may return to routine yearly bilateral screening mammogram.
TECHNIQUE: Digital bilateral mammograms and 3-D Tomosynthesis were obtained. 
These were interpreted both primarily and with the aid of computer-aided 
detection system.  
BREAST DENSITY: (Level C) The breasts are heterogeneously dense, which may 
obscure small masses.

[R CC]
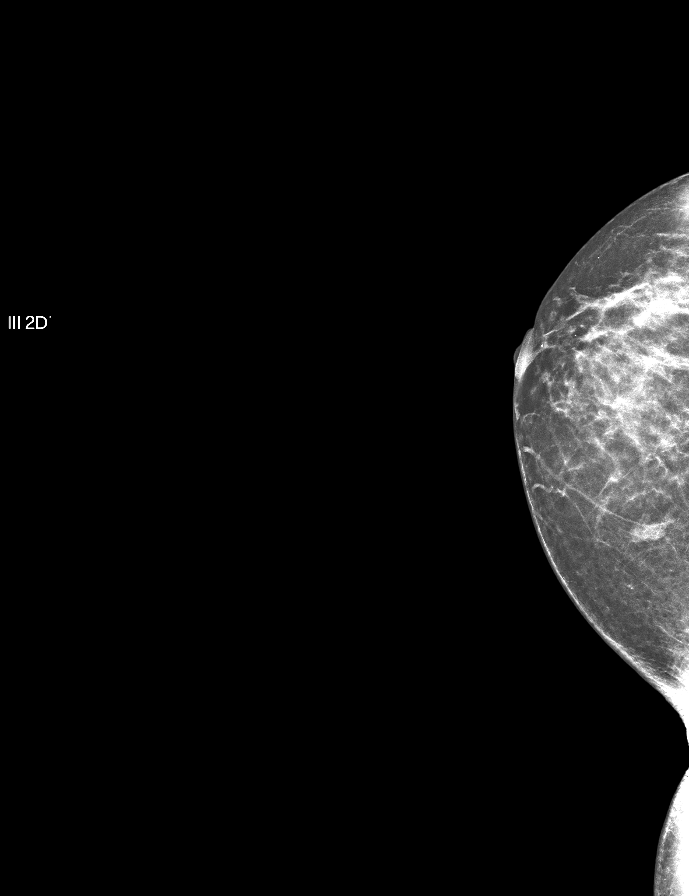

[L CC]
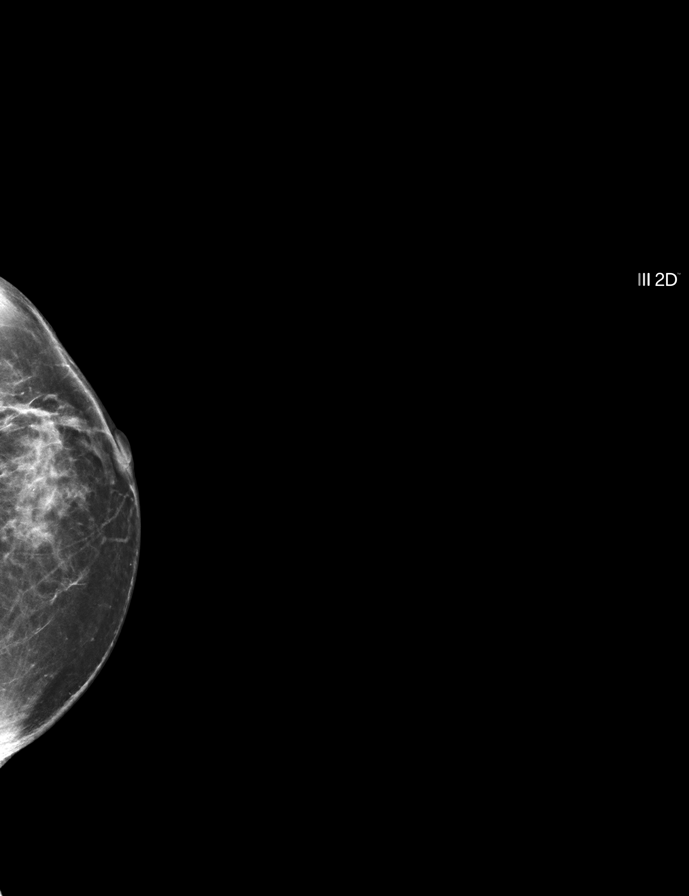

[R MLO]
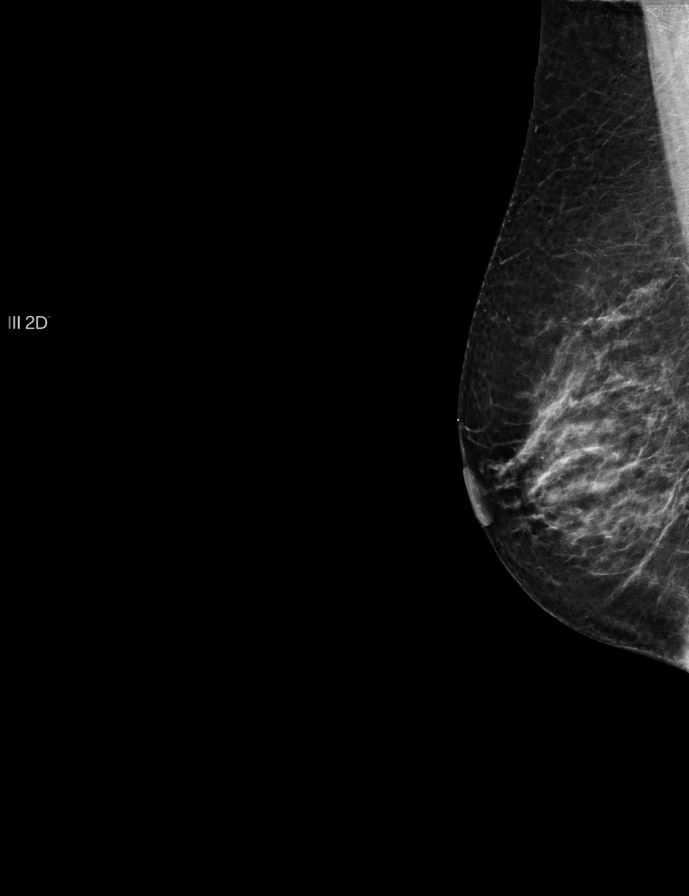

[L MLO]
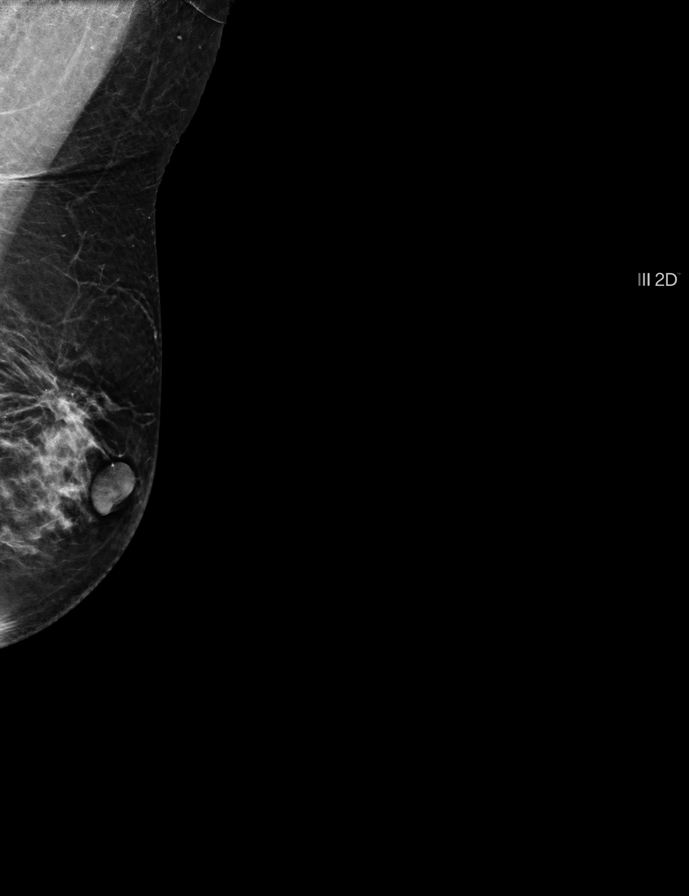

[R MLO tomo · tomo slice 32/63.0]
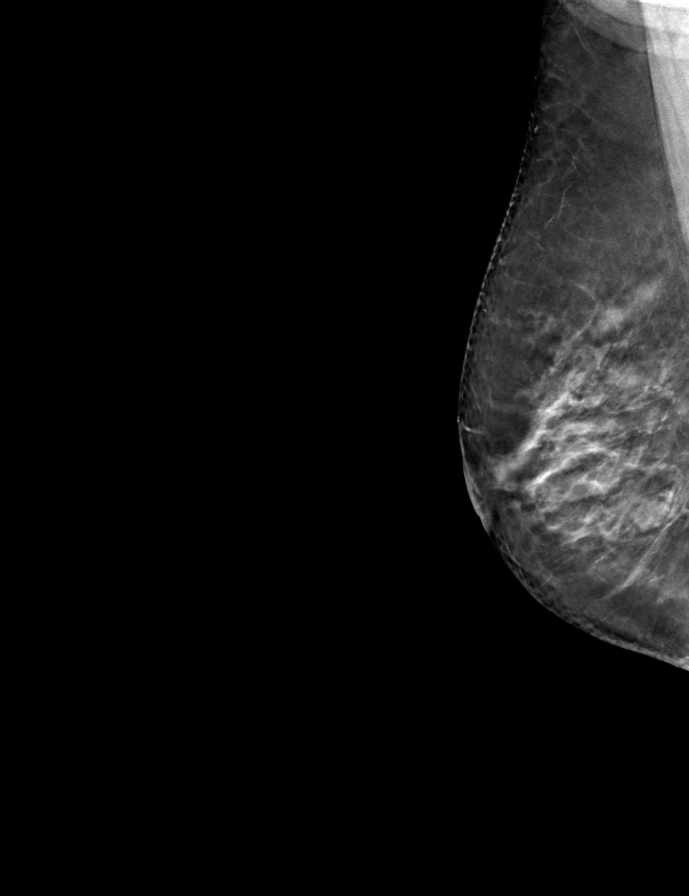

[R CC tomo · tomo slice 28/55.0]
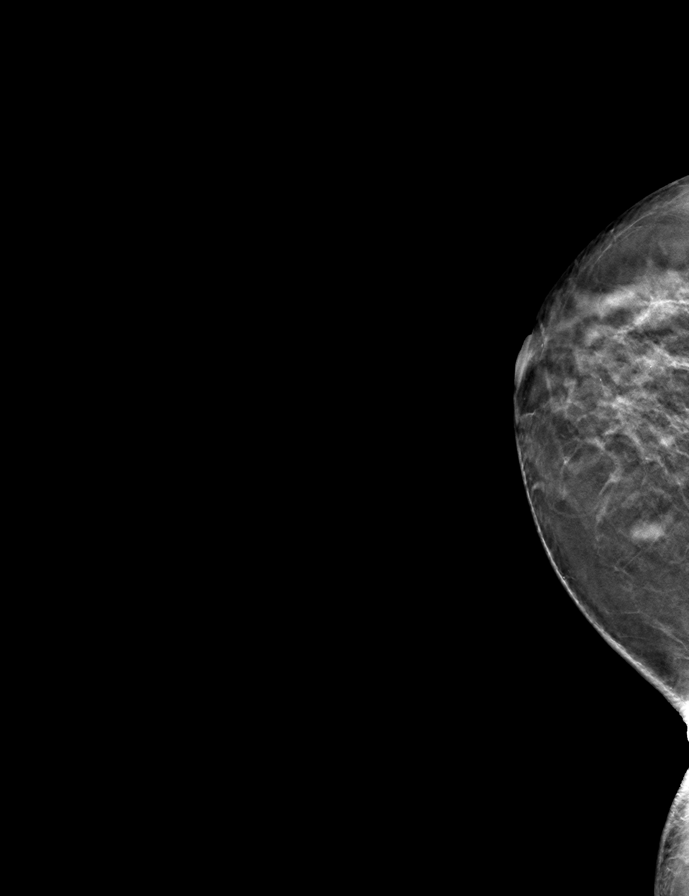

[L MLO tomo · tomo slice 30/59.0]
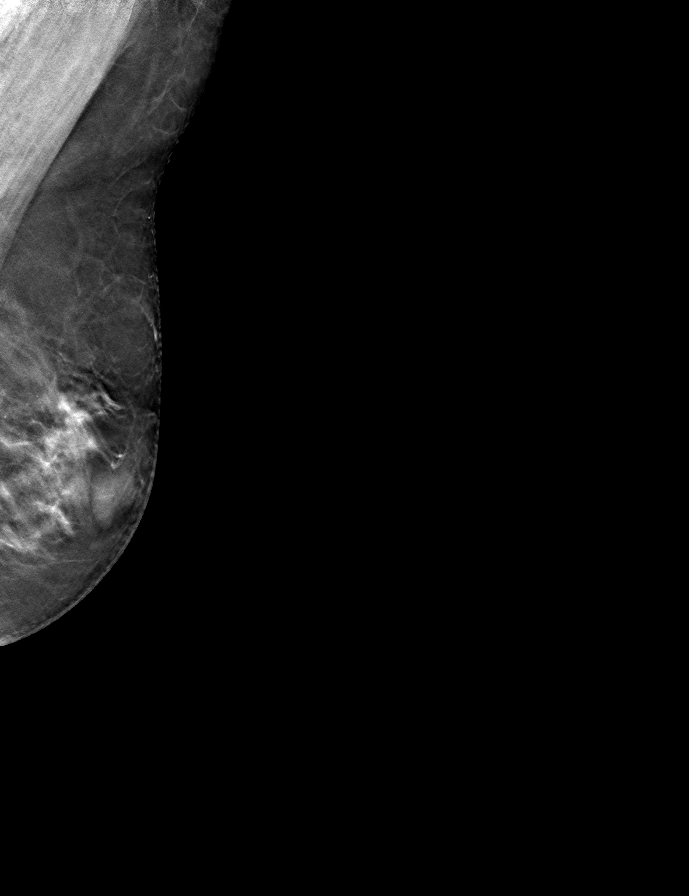

[L CC tomo · tomo slice 32/63.0]
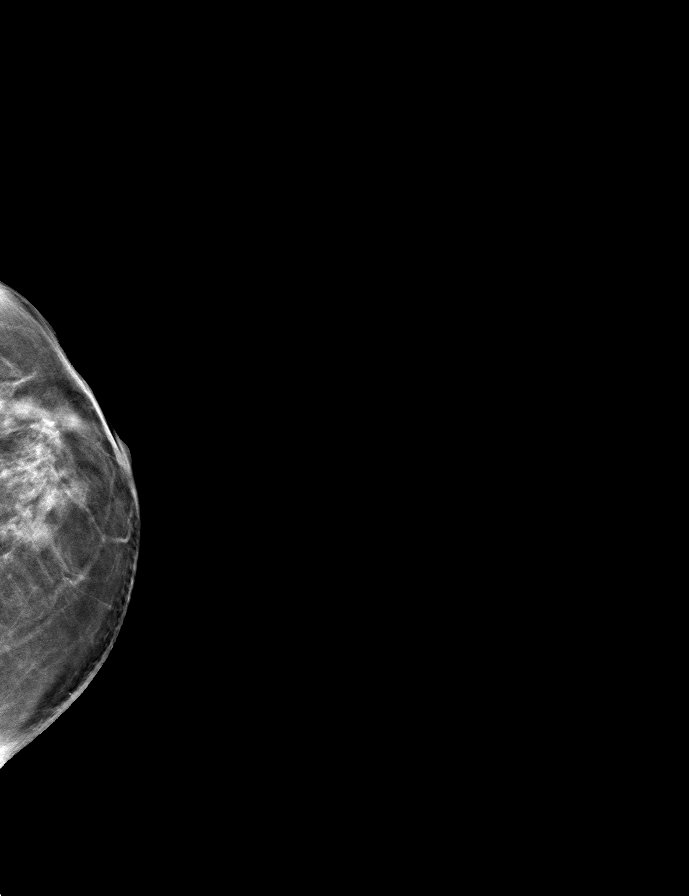

[8 of 24 positions shown; findings below may reference images not displayed]

IMPRESSION: BI-RADS 2. Benign findings. Recommend follow-up yearly screening 
mammograms. 
MAMMOGRAPHY SCREENING BILATERAL 3CHRISTU BAARD, 03/07/2023 [DATE]: 
CLINICAL INDICATION: Encounter for screening mammogram. Previous left lumpectomy 
and radiation therapy.
FINDINGS: There is suspected scarring in the left breast which may be due to 
posttreatment changes and will attempt to acquire previous mammograms is 
suggested by this finding. There is also increased density in the medial right 
breast and we will also attempt to acquire previous mammograms for further 
evaluation. If prior mammograms cannot be located patient may need brought back 
for additional views of bilateral breasts and limited bilateral breast 
ultrasound needed for further evaluation.
IMPRESSION: We will attempt to acquire previous mammograms and if they cannot be located, 
the patient may need to be brought back for additional mammographic views of 
both breasts and limited bilateral breast ultrasound if clinically warranted. 
(BI-RADS 0) Incomplete. Further evaluation will be performed as discussed above 
and the results will be reported separately.
